# Patient Record
Sex: Female | Born: 1975 | Race: White | Hispanic: No | Marital: Married | Smoking: Former smoker
Health system: Southern US, Community
[De-identification: ages and names within clinical notes are randomized; demographics above are authoritative.]

## PROBLEM LIST (undated history)

## (undated) ENCOUNTER — Inpatient Hospital Stay (HOSPITAL_COMMUNITY): Payer: Self-pay

## (undated) DIAGNOSIS — Z9889 Other specified postprocedural states: Secondary | ICD-10-CM

## (undated) DIAGNOSIS — R112 Nausea with vomiting, unspecified: Secondary | ICD-10-CM

## (undated) DIAGNOSIS — Z87898 Personal history of other specified conditions: Secondary | ICD-10-CM

## (undated) DIAGNOSIS — T8859XA Other complications of anesthesia, initial encounter: Secondary | ICD-10-CM

## (undated) DIAGNOSIS — E119 Type 2 diabetes mellitus without complications: Secondary | ICD-10-CM

## (undated) DIAGNOSIS — IMO0001 Reserved for inherently not codable concepts without codable children: Secondary | ICD-10-CM

## (undated) DIAGNOSIS — T4145XA Adverse effect of unspecified anesthetic, initial encounter: Secondary | ICD-10-CM

## (undated) DIAGNOSIS — I10 Essential (primary) hypertension: Secondary | ICD-10-CM

## (undated) DIAGNOSIS — Z789 Other specified health status: Secondary | ICD-10-CM

## (undated) DIAGNOSIS — S60511A Abrasion of right hand, initial encounter: Secondary | ICD-10-CM

## (undated) DIAGNOSIS — F419 Anxiety disorder, unspecified: Secondary | ICD-10-CM

## (undated) DIAGNOSIS — J302 Other seasonal allergic rhinitis: Secondary | ICD-10-CM

## (undated) DIAGNOSIS — G478 Other sleep disorders: Secondary | ICD-10-CM

## (undated) DIAGNOSIS — Z794 Long term (current) use of insulin: Secondary | ICD-10-CM

## (undated) DIAGNOSIS — M65312 Trigger thumb, left thumb: Secondary | ICD-10-CM

## (undated) DIAGNOSIS — Z8489 Family history of other specified conditions: Secondary | ICD-10-CM

## (undated) HISTORY — PX: ANKLE SURGERY: SHX546

## (undated) HISTORY — PX: TONSILLECTOMY AND ADENOIDECTOMY: SHX28

## (undated) HISTORY — PX: NASAL SEPTUM SURGERY: SHX37

## (undated) HISTORY — PX: WISDOM TOOTH EXTRACTION: SHX21

---

## 1997-09-16 ENCOUNTER — Other Ambulatory Visit: Admission: RE | Admit: 1997-09-16 | Discharge: 1997-09-16 | Payer: Self-pay | Admitting: Obstetrics and Gynecology

## 1998-09-23 ENCOUNTER — Other Ambulatory Visit: Admission: RE | Admit: 1998-09-23 | Discharge: 1998-09-23 | Payer: Self-pay | Admitting: Obstetrics and Gynecology

## 2000-06-20 ENCOUNTER — Other Ambulatory Visit: Admission: RE | Admit: 2000-06-20 | Discharge: 2000-06-20 | Payer: Self-pay | Admitting: Obstetrics and Gynecology

## 2008-07-28 ENCOUNTER — Ambulatory Visit: Payer: Self-pay | Admitting: Family Medicine

## 2009-09-24 ENCOUNTER — Ambulatory Visit (HOSPITAL_BASED_OUTPATIENT_CLINIC_OR_DEPARTMENT_OTHER): Admission: RE | Admit: 2009-09-24 | Discharge: 2009-09-24 | Payer: Self-pay

## 2009-10-03 ENCOUNTER — Ambulatory Visit: Payer: Self-pay | Admitting: Internal Medicine

## 2010-01-28 ENCOUNTER — Encounter (HOSPITAL_BASED_OUTPATIENT_CLINIC_OR_DEPARTMENT_OTHER): Admission: RE | Admit: 2010-01-28 | Discharge: 2010-02-16 | Payer: Self-pay | Admitting: General Surgery

## 2010-06-24 ENCOUNTER — Ambulatory Visit (INDEPENDENT_AMBULATORY_CARE_PROVIDER_SITE_OTHER): Payer: BC Managed Care – PPO | Admitting: Family Medicine

## 2010-06-24 DIAGNOSIS — S139XXA Sprain of joints and ligaments of unspecified parts of neck, initial encounter: Secondary | ICD-10-CM

## 2010-06-28 ENCOUNTER — Ambulatory Visit (INDEPENDENT_AMBULATORY_CARE_PROVIDER_SITE_OTHER): Payer: BC Managed Care – PPO | Admitting: Family Medicine

## 2010-06-28 DIAGNOSIS — T7840XA Allergy, unspecified, initial encounter: Secondary | ICD-10-CM

## 2010-07-08 ENCOUNTER — Ambulatory Visit (INDEPENDENT_AMBULATORY_CARE_PROVIDER_SITE_OTHER): Payer: BC Managed Care – PPO | Admitting: Family Medicine

## 2010-07-08 DIAGNOSIS — S139XXA Sprain of joints and ligaments of unspecified parts of neck, initial encounter: Secondary | ICD-10-CM

## 2010-09-15 ENCOUNTER — Ambulatory Visit (INDEPENDENT_AMBULATORY_CARE_PROVIDER_SITE_OTHER): Payer: BC Managed Care – PPO | Admitting: Family Medicine

## 2010-09-15 ENCOUNTER — Encounter: Payer: Self-pay | Admitting: Family Medicine

## 2010-09-15 VITALS — BP 120/80 | HR 78 | Wt 165.0 lb

## 2010-09-15 DIAGNOSIS — M542 Cervicalgia: Secondary | ICD-10-CM
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
## 2010-09-15 NOTE — Patient Instructions (Signed)
I will refer you to physical therapy and if this does not work, I will refer to a chiropractor for their help.

## 2010-12-15 ENCOUNTER — Ambulatory Visit (INDEPENDENT_AMBULATORY_CARE_PROVIDER_SITE_OTHER): Payer: BC Managed Care – PPO | Admitting: Ophthalmology

## 2010-12-15 DIAGNOSIS — E11319 Type 2 diabetes mellitus with unspecified diabetic retinopathy without macular edema: Secondary | ICD-10-CM

## 2010-12-15 DIAGNOSIS — H43819 Vitreous degeneration, unspecified eye: Secondary | ICD-10-CM

## 2010-12-15 DIAGNOSIS — H2510 Age-related nuclear cataract, unspecified eye: Secondary | ICD-10-CM

## 2011-04-07 ENCOUNTER — Encounter: Payer: Self-pay | Admitting: Internal Medicine

## 2011-04-07 ENCOUNTER — Ambulatory Visit (INDEPENDENT_AMBULATORY_CARE_PROVIDER_SITE_OTHER): Payer: BC Managed Care – PPO | Admitting: Family Medicine

## 2011-04-07 DIAGNOSIS — E119 Type 2 diabetes mellitus without complications: Secondary | ICD-10-CM | POA: Insufficient documentation

## 2011-04-07 MED ORDER — HYDROCOD POLST-CHLORPHEN POLST 10-8 MG/5ML PO LQCR: 5.0000 mL | Freq: Two times a day (BID) | ORAL | Status: DC | PRN

## 2011-04-07 MED ORDER — AMOXICILLIN 875 MG PO TABS: 875.0000 mg | ORAL_TABLET | Freq: Two times a day (BID) | ORAL | Status: AC

## 2011-04-07 NOTE — Patient Instructions (Signed)
Take all the antibiotics and call me if not totally back to normal.

## 2011-04-07 NOTE — Progress Notes (Signed)
  Subjective:    Patient ID: Brandi Blevins, female    DOB: [birth year], 35 y.o.   MRN: [id number]  HPI 3 days ago she woke up with a sore throat, ear congestion. Next days the throat and congestion were worse with hoarse voice. Yesterday her cough became productive. She has also noted a worsening in her blood sugar readings. She states that she rarely gets a productive cough and a change in her blood sugars from URIs. Review of Systems     Objective:   Physical Exam alert and in no distress. Tympanic membranes and canals are normal. Throat is clear. Tonsils are normal. Neck is supple without adenopathy or thyromegaly. Cardiac exam shows a regular sinus rhythm without murmurs or gallops. Lungs are clear to auscultation.        Assessment & Plan:  Bronchitis. I will treat with amoxicillin. She is to call me at the end of the course of not entirely better

## 2011-04-21 ENCOUNTER — Telehealth: Payer: Self-pay | Admitting: Internal Medicine

## 2011-04-21 MED ORDER — AMOXICILLIN 875 MG PO TABS: 875.0000 mg | ORAL_TABLET | Freq: Two times a day (BID) | ORAL | Status: AC

## 2011-04-21 NOTE — Telephone Encounter (Signed)
Amoxil called in

## 2011-06-17 ENCOUNTER — Ambulatory Visit (INDEPENDENT_AMBULATORY_CARE_PROVIDER_SITE_OTHER): Payer: BC Managed Care – PPO | Admitting: Ophthalmology

## 2011-06-17 DIAGNOSIS — E11319 Type 2 diabetes mellitus with unspecified diabetic retinopathy without macular edema: Secondary | ICD-10-CM

## 2011-06-17 DIAGNOSIS — E1039 Type 1 diabetes mellitus with other diabetic ophthalmic complication: Secondary | ICD-10-CM

## 2011-06-17 DIAGNOSIS — H43819 Vitreous degeneration, unspecified eye: Secondary | ICD-10-CM

## 2011-08-01 ENCOUNTER — Ambulatory Visit (INDEPENDENT_AMBULATORY_CARE_PROVIDER_SITE_OTHER): Payer: BC Managed Care – PPO | Admitting: Family Medicine

## 2011-08-01 ENCOUNTER — Encounter: Payer: Self-pay | Admitting: Family Medicine

## 2011-08-01 VITALS — BP 120/80 | HR 68 | Temp 98.6°F | Resp 16 | Wt 172.0 lb

## 2011-08-01 DIAGNOSIS — H9209 Otalgia, unspecified ear: Secondary | ICD-10-CM

## 2011-08-01 DIAGNOSIS — J309 Allergic rhinitis, unspecified: Secondary | ICD-10-CM

## 2011-08-01 DIAGNOSIS — H9202 Otalgia, left ear: Secondary | ICD-10-CM

## 2011-08-01 DIAGNOSIS — H6980 Other specified disorders of Eustachian tube, unspecified ear: Secondary | ICD-10-CM

## 2011-08-01 DIAGNOSIS — H6120 Impacted cerumen, unspecified ear: Secondary | ICD-10-CM

## 2011-08-01 NOTE — Progress Notes (Signed)
Patient presents for evaluation of L ear pain.  She has allergies, but wants ear checked before going on plane tomorrow.  She admits to cleaning her ears with q-tips.  Yesterday when she cleaned her ears, it was painful.  Has pain with yawning, and with moving her external ear around (pulling on ear).  Some popping of ears.  Denies any ear drainage.  Ear feels stuffy, slight decrease in hearing.  Allergies have been pretty well controlled with her current regimen.  S/p sinus surgery, and since then her sinuses have been doing much better, only infrequent sinus infections.  Sugar was a little higher than normal since yesterday, 190 today.  Past Medical History  Diagnosis Date  . Diabetes mellitus     insulin dep  . Allergic rhinitis    Past Surgical History  Procedure Date  . Ankle surgery     x2  . Tonsillectomy   . Nasal sinus surgery    Current Outpatient Prescriptions on File Prior to Visit  Medication Sig Dispense Refill  . escitalopram (LEXAPRO) 10 MG tablet Take 10 mg by mouth daily.        . fluticasone (FLONASE) 50 MCG/ACT nasal spray Place 2 sprays into the nose daily.        . insulin aspart (NOVOLOG) 100 UNIT/ML injection Inject into the skin 3 (three) times daily before meals.       Marland Kitchen levocetirizine (XYZAL) 5 MG tablet Take 5 mg by mouth every evening.        . Multiple Vitamins-Minerals (MULTIVITAMIN WITH MINERALS) tablet Take 1 tablet by mouth daily.         MEDS:  Also reports taking a med for blood pressure, which she believes to be a diuretic--thinks it is HCTZ.  Allergies  Allergen Reactions  . Advil (Ibuprofen)    ROS: Denies headaches, fevers, nausea, vomiting, diarrhea, skin rash, chest pain, shortness of breath or cough.  +sneezing.  PHYSICAL EXAM: BP 120/80  Pulse 68  Temp(Src) 98.6 F (37 C) (Oral)  Resp 16  Wt 172 lb (78.019 kg)  LMP 07/21/2011 Well developed, pleasant female in no distress HEENT:  PERRL, EOMI, conjunctiva clear. Nasal mucosa  moderately edematous, L>R with clear-white mucus TM and EAC normal on right.  On Left, slight redness of EAC mid canal, no exudate.  TM obscured by cerumen  After lavage and use of curette, TM was visualized and normal.  OP clear Neck: no lymphadenopathy, thyromegaly or mass Heart: regular rate and rhythm Lungs: clear  ASSESSMENT/PLAN: 1. Otalgia of left ear    2. Cerumen impaction  Ear cerumen removal  3. Allergic rhinitis, cause unspecified    4. Eustachian tube dysfunction     Advised NOT to use Q-tips. Use Sudafed prior to flying (since BP is well controlled)--vs using Afrin Continue flonase and xyzal If persistent/worsening L ear pain, can call for ABX drops to treat possible OE

## 2011-08-01 NOTE — Patient Instructions (Signed)
Use sudafed before flying. Don't use q-tips Call later in week if worsening ear pain for ear drops--I don't think it is necessary now, only if pain worsens

## 2011-11-18 ENCOUNTER — Other Ambulatory Visit: Payer: Self-pay

## 2011-11-18 LAB — OB RESULTS CONSOLE HEPATITIS B SURFACE ANTIGEN: Hepatitis B Surface Ag: NEGATIVE

## 2011-11-18 LAB — OB RESULTS CONSOLE RPR: RPR: NONREACTIVE

## 2011-11-18 LAB — OB RESULTS CONSOLE RUBELLA ANTIBODY, IGM: Rubella: IMMUNE

## 2011-12-14 ENCOUNTER — Other Ambulatory Visit (HOSPITAL_COMMUNITY): Payer: Self-pay | Admitting: Obstetrics and Gynecology

## 2011-12-14 DIAGNOSIS — O24919 Unspecified diabetes mellitus in pregnancy, unspecified trimester: Secondary | ICD-10-CM

## 2011-12-14 DIAGNOSIS — Z3689 Encounter for other specified antenatal screening: Secondary | ICD-10-CM

## 2011-12-23 ENCOUNTER — Ambulatory Visit (INDEPENDENT_AMBULATORY_CARE_PROVIDER_SITE_OTHER): Payer: BC Managed Care – PPO | Admitting: Ophthalmology

## 2011-12-30 ENCOUNTER — Ambulatory Visit (HOSPITAL_COMMUNITY)
Admission: RE | Admit: 2011-12-30 | Discharge: 2011-12-30 | Disposition: A | Discharge status: Home / Self Care | Payer: BC Managed Care – PPO | Source: Ambulatory Visit | Attending: Obstetrics and Gynecology | Admitting: Obstetrics and Gynecology

## 2011-12-30 ENCOUNTER — Encounter (HOSPITAL_COMMUNITY): Payer: Self-pay

## 2011-12-30 VITALS — BP 124/72 | HR 70 | Wt 171.5 lb

## 2011-12-30 DIAGNOSIS — O10019 Pre-existing essential hypertension complicating pregnancy, unspecified trimester: Secondary | ICD-10-CM | POA: Insufficient documentation

## 2011-12-30 DIAGNOSIS — O24919 Unspecified diabetes mellitus in pregnancy, unspecified trimester: Secondary | ICD-10-CM

## 2011-12-30 DIAGNOSIS — Z363 Encounter for antenatal screening for malformations: Secondary | ICD-10-CM | POA: Insufficient documentation

## 2011-12-30 DIAGNOSIS — O358XX0 Maternal care for other (suspected) fetal abnormality and damage, not applicable or unspecified: Secondary | ICD-10-CM | POA: Insufficient documentation

## 2011-12-30 DIAGNOSIS — O09519 Supervision of elderly primigravida, unspecified trimester: Secondary | ICD-10-CM | POA: Insufficient documentation

## 2011-12-30 DIAGNOSIS — Z1389 Encounter for screening for other disorder: Secondary | ICD-10-CM | POA: Insufficient documentation

## 2011-12-30 DIAGNOSIS — Z3689 Encounter for other specified antenatal screening: Secondary | ICD-10-CM

## 2011-12-30 HISTORY — DX: Essential (primary) hypertension: I10

## 2011-12-30 NOTE — Progress Notes (Signed)
Ms. Comrie had an ultrasound appointment today.  Please see [facility] report for details.  Comments An active singleton fetus is observed.  Biometry is appropriate for gestational age.  Amniotic fluid volume is normal.  Screening survey of the fetal anatomy was performed and no dysmorphic features are detected on attempted comprehensive anatomic survey limited by early gestational age. Images of the fetal heart (4 Chamber, LVOT, RVOT, aortic arch, and ductal arch) and palate are suboptimal and warrant follow up evaluation.  Impression Active singleton fetus Normal anatomic survey (limited by gestational age) Suboptimal imaging of the fetal heart (4 Chamber, LVOT, RVOT, aortic arch, and ductal arch) and palate  Maternal Diabetes Mellitus Chronic Hypertension  Recommendations 1. As a courtesy, I scheduled your patient a follow up ultrasound to complete our evaluation of the fetal anatomy (heart and palate) and assess interval growth in 4 weeks.   2. Due to maternal history of DM, I ordered a referral to pediatric cardiology for fetal echocardiogram at around [redacted] weeks gestational age.  Rogelia Boga, MD, MS, FACOG Assistant Professor Section of Maternal-Fetal Medicine [facility]

## 2012-01-27 ENCOUNTER — Ambulatory Visit (HOSPITAL_COMMUNITY)
Admission: RE | Admit: 2012-01-27 | Discharge: 2012-01-27 | Disposition: A | Discharge status: Home / Self Care | Payer: BC Managed Care – PPO | Source: Ambulatory Visit | Attending: Obstetrics and Gynecology | Admitting: Obstetrics and Gynecology

## 2012-01-27 DIAGNOSIS — O09519 Supervision of elderly primigravida, unspecified trimester: Secondary | ICD-10-CM | POA: Insufficient documentation

## 2012-01-27 DIAGNOSIS — O24919 Unspecified diabetes mellitus in pregnancy, unspecified trimester: Secondary | ICD-10-CM

## 2012-01-27 DIAGNOSIS — O10019 Pre-existing essential hypertension complicating pregnancy, unspecified trimester: Secondary | ICD-10-CM | POA: Insufficient documentation

## 2012-01-27 DIAGNOSIS — Z3689 Encounter for other specified antenatal screening: Secondary | ICD-10-CM | POA: Insufficient documentation

## 2012-01-27 NOTE — Progress Notes (Signed)
[facility]  Indication: 36 yr old G1P0 at [redacted]w[redacted]d with type I diabetes and likely chronic hypertension for follow up ultrasound to complete anatomy.  Findings: 1. Single intrauterine pregnancy. 2. Estimated fetal weight is in the 40th%. 3. Posterior placenta without evidence of previa. 4. Normal amniotic fluid volume. 5. The limited anatomy survey is normal. Any anatomy not seen on today's exam was evaluated on the previous exam. 6. Normal transabdominal cervical length.  Recommendations: 1. Appropriate fetal growth. 2. Fetal anatomic survey is complete. 3. Type I Diabetes: - recommend fetal echocardiogram- scheduled for 02/06/12 - recommend fetal growth ultrasounds every 4 weeks; can be done in our office if desired - recommend antenatal testing starting at [redacted] weeks gestation 4. Likely chronic hypertension: - well controlled on nifedipine - recommend serial fetal growth and antenatal testing as above  Eulis Foster, MD

## 2012-01-31 ENCOUNTER — Encounter (HOSPITAL_COMMUNITY): Payer: Self-pay | Admitting: Obstetrics and Gynecology

## 2012-02-02 ENCOUNTER — Other Ambulatory Visit (HOSPITAL_COMMUNITY): Payer: Self-pay | Admitting: Obstetrics and Gynecology

## 2012-02-02 DIAGNOSIS — O24919 Unspecified diabetes mellitus in pregnancy, unspecified trimester: Secondary | ICD-10-CM

## 2012-02-06 ENCOUNTER — Ambulatory Visit (HOSPITAL_COMMUNITY)
Admission: RE | Admit: 2012-02-06 | Discharge: 2012-02-06 | Disposition: A | Discharge status: Home / Self Care | Payer: BC Managed Care – PPO | Source: Ambulatory Visit | Attending: Obstetrics and Gynecology | Admitting: Obstetrics and Gynecology

## 2012-02-06 ENCOUNTER — Other Ambulatory Visit (HOSPITAL_COMMUNITY): Payer: Self-pay | Admitting: Obstetrics and Gynecology

## 2012-02-06 DIAGNOSIS — O24919 Unspecified diabetes mellitus in pregnancy, unspecified trimester: Secondary | ICD-10-CM

## 2012-02-15 NOTE — Consult Note (Signed)
Dear  Colleagues,  I had the pleasure of seeing Brandi Blevins on 28 Oct 13 for consultation in the Fetal Heart Program at [facility]. As you know, she is a 36 y.o. G1P whose pregnancy was at 23 weeks 3 days gestation at the time of our visit.  She was referred for fetal cardiovascular evaluation secondary to maternal diabetes (juvenile onset). This pregnancy was conceived naturally. Amniocentesis has not been required. She has not experienced any significant intercurrent illnesses or hospitalizations. She is on prenatal vitamins and insulin and she also requires Nifedipine for control of hypertension and Rhinocort for seasonal allergies.  Her sugar control has been fairly well controlled by Betsey's report.  A recent HgA1C of 6.6% is noted.  Zaara thinks it was probably in same range or better during first trimester.  She denies any exposure to tobacco, alcohol or recreational drugs. She denies any complaints today.  All systems reviewed and negative other than listed in HPI.  There is no family history of congenital heart disease, birth defects, genetic abnormalities, arrhythmia, pacemakers or sudden cardiac death.    We performed a complete Fetal echocardiogram with Doppler assessment which I personally reviewed and interpreted. Please refer to a copy of the final report in [facility] for further details.  Acoustic windows were actually quite good.  We did not identify any significant intracardiac abnormalities - no structural defects.  There was normal Doppler patterns in the umbilical vessels, ductus venosus and middle cerebral artery. There was a normal fetal heart rate and rhythm.  In addition, there was no ventricular hypertrophy.  This information was discussed with Raelan and her husband. We also discussed that fetal echocardiography may not be able to detect minor abnormalities such as small ventricular septal defects, subtle valve abnormalities or mild coarctation of  the aorta. Nor can it predict postnatal persistence of normal fetal structures such as a patent foramen ovale or patent ductus arteriosus.  I do not feel that Rahel requires a prenatal follow-up appointment in our [facility] Fetal [facility] here at [facility].  But I am glad to see her back if you have any persistent concerns or if new clinical problems arise. I also do not feel that a post-natal echocardiogram is necessary after delivery unless indicated by clinical conditions. Please do not hesitate to contact me with any questions or concerns.  Thank you for involving me in the care of your patient today.   Sincerely,  Massie Bougie, MD Pediatric Cardiology [facility]  Total Time: 30 minues. Time of Counseling/Coordinating care: 20 minutes.

## 2012-03-02 ENCOUNTER — Telehealth: Payer: Self-pay | Admitting: *Deleted

## 2012-03-02 ENCOUNTER — Encounter: Payer: Self-pay | Admitting: *Deleted

## 2012-03-02 ENCOUNTER — Ambulatory Visit (INDEPENDENT_AMBULATORY_CARE_PROVIDER_SITE_OTHER): Payer: BC Managed Care – PPO | Admitting: *Deleted

## 2012-03-02 VITALS — Ht 63.0 in

## 2012-03-02 DIAGNOSIS — IMO0002 Reserved for concepts with insufficient information to code with codable children: Secondary | ICD-10-CM

## 2012-03-02 DIAGNOSIS — O24919 Unspecified diabetes mellitus in pregnancy, unspecified trimester: Secondary | ICD-10-CM

## 2012-03-02 NOTE — Telephone Encounter (Signed)
I spoke with [facility] for diagnosis and if they needed original EKG sent to them. I have faxed a copy of the ekg to them as well as giving a copy of EKG to the pt. Mylo Red RN

## 2012-03-03 ENCOUNTER — Encounter (HOSPITAL_COMMUNITY): Payer: Self-pay

## 2012-03-03 ENCOUNTER — Inpatient Hospital Stay (HOSPITAL_COMMUNITY)
Admission: AD | Admit: 2012-03-03 | Discharge: 2012-03-03 | Disposition: A | Discharge status: Home / Self Care | Payer: BC Managed Care – PPO | Source: Ambulatory Visit | Attending: Obstetrics and Gynecology | Admitting: Obstetrics and Gynecology

## 2012-03-03 DIAGNOSIS — O4700 False labor before 37 completed weeks of gestation, unspecified trimester: Secondary | ICD-10-CM | POA: Insufficient documentation

## 2012-03-03 DIAGNOSIS — E109 Type 1 diabetes mellitus without complications: Secondary | ICD-10-CM | POA: Insufficient documentation

## 2012-03-03 DIAGNOSIS — O24919 Unspecified diabetes mellitus in pregnancy, unspecified trimester: Secondary | ICD-10-CM | POA: Insufficient documentation

## 2012-03-03 DIAGNOSIS — K529 Noninfective gastroenteritis and colitis, unspecified: Secondary | ICD-10-CM

## 2012-03-03 DIAGNOSIS — O99891 Other specified diseases and conditions complicating pregnancy: Secondary | ICD-10-CM | POA: Insufficient documentation

## 2012-03-03 DIAGNOSIS — A059 Bacterial foodborne intoxication, unspecified: Secondary | ICD-10-CM | POA: Insufficient documentation

## 2012-03-03 DIAGNOSIS — K5289 Other specified noninfective gastroenteritis and colitis: Secondary | ICD-10-CM

## 2012-03-03 HISTORY — DX: Other complications of anesthesia, initial encounter: T88.59XA

## 2012-03-03 HISTORY — DX: Anxiety disorder, unspecified: F41.9

## 2012-03-03 HISTORY — DX: Nausea with vomiting, unspecified: R11.2

## 2012-03-03 HISTORY — DX: Other specified postprocedural states: Z98.890

## 2012-03-03 HISTORY — DX: Adverse effect of unspecified anesthetic, initial encounter: T41.45XA

## 2012-03-03 LAB — URINALYSIS, ROUTINE W REFLEX MICROSCOPIC
Nitrite: NEGATIVE
Specific Gravity, Urine: 1.025 (ref 1.005–1.030)
Urobilinogen, UA: 0.2 mg/dL (ref 0.0–1.0)
pH: 6 (ref 5.0–8.0)

## 2012-03-03 LAB — URINE MICROSCOPIC-ADD ON

## 2012-03-03 LAB — FETAL FIBRONECTIN: Fetal Fibronectin: NEGATIVE

## 2012-03-03 MED ORDER — ONDANSETRON HCL 4 MG PO TABS
8.0000 mg | ORAL_TABLET | Freq: Once | ORAL | Status: AC
  Administered 2012-03-03: 8 mg via ORAL
  Filled 2012-03-03: qty 2

## 2012-03-03 MED ORDER — ONDANSETRON HCL 8 MG PO TABS: 8.0000 mg | ORAL_TABLET | Freq: Three times a day (TID) | ORAL | Status: DC | PRN

## 2012-03-03 NOTE — MAU Note (Signed)
N/v/d x2 episodes this morning and contractions since she woke up 1.5 hr ago. Doesn't know how frequent. Denies vaginal bleeding/leaking of fluid.

## 2012-03-03 NOTE — MAU Provider Note (Signed)
History     CSN: [id number]  Arrival date and time: 03/03/12 4098   First Provider Initiated Contact with Patient 03/03/12 [phone]      Chief Complaint  Patient presents with  . Contractions  . Emesis During Pregnancy   HPI This is a 36 y.o. female at [redacted]w[redacted]d who presents with c/o episode at home with contractions described as painful, sharp and dull, lower abdomen with tightening. This was followed by emesis x 2 and then diarrhea.  Husband states he also feels nauseated. She denies fever or abdominal pain. No history of PTL.  No bleeding. Has not had any contractions since arrival to hospital.  History is notable for Type I Diabetes. Blood sugar this morning was 195 which was unusual for her. She gave herself insulin for this. She is on an insulin pump.  RN Note: N/v/d x2 episodes this morning and contractions since she woke up 1.5 hr ago. Doesn't know how frequent. Denies vaginal bleeding/leaking of fluid.       OB History    Grav Para Term Preterm Abortions TAB SAB Ect Mult Living   1 0 0 0 0 0 0 0 0 0       Past Medical History  Diagnosis Date  . Allergic rhinitis   . Hypertension     chronic HTN x2 yrs  . Diabetes mellitus     insulin dep  . Anxiety   . Complication of anesthesia   . PONV (postoperative nausea and vomiting)     Past Surgical History  Procedure Date  . Ankle surgery     x2  . Tonsillectomy   . Nasal sinus surgery   . Wisdom tooth extraction     History reviewed. No pertinent family history.  History  Substance Use Topics  . Smoking status: Former Smoker    Quit date: 04/11/2005  . Smokeless tobacco: Never Used  . Alcohol Use: Yes     Comment: 1-2 glasses of wine 3-4 days/week    Allergies:  Allergies  Allergen Reactions  . Advil (Ibuprofen) Hives    Prescriptions prior to admission  Medication Sig Dispense Refill  . Budesonide (RHINOCORT AQUA NA) Place 1 spray into the nose.      . insulin aspart (NOVOLOG) 100 UNIT/ML injection  Inject into the skin 3 (three) times daily before meals.       Marland Kitchen levocetirizine (XYZAL) 5 MG tablet Take 5 mg by mouth every evening.        . Multiple Vitamins-Minerals (MULTIVITAMIN WITH MINERALS) tablet Take 1 tablet by mouth daily.        Marland Kitchen NIFEdipine (PROCARDIA-XL/ADALAT-CC/NIFEDICAL-XL) 30 MG 24 hr tablet Take 30 mg by mouth daily.      Marland Kitchen escitalopram (LEXAPRO) 10 MG tablet Take 10 mg by mouth daily.        . fluticasone (FLONASE) 50 MCG/ACT nasal spray Place 2 sprays into the nose daily.          ROS See HPI  Physical Exam   Blood pressure 131/87, pulse 93, temperature 97.1 F (36.2 C), temperature source Oral, resp. rate 18, last menstrual period 08/22/2011.  Physical Exam  Constitutional: She is oriented to person, place, and time. She appears well-developed and well-nourished. No distress.  Cardiovascular: Normal rate.   Respiratory: Effort normal.  GI: Soft. She exhibits no distension and no mass. There is no tenderness. There is no rebound and no guarding.  Genitourinary: Vagina normal and uterus normal. No vaginal discharge found.  Dilation: Closed Effacement (%): Thick Exam by:: Wynelle Bourgeois CNM   Musculoskeletal: Normal range of motion.  Neurological: She is alert and oriented to person, place, and time.  Skin: Skin is warm and dry.  Psychiatric: She has a normal mood and affect.  FHR reassuring with small accels No contractions.  MAU Course  Procedures  Discussed with DR Henderson Cloud. Will send FFn and give dose of Zofran. Then try sips of PO fluids. >>> FFn is Negative. No contractions while here. Feels better, Tolerating PO fluids.  Assessment and Plan  A:  SIUP at [redacted]w[redacted]d       Gastroenteritis vs food poisoning      Preterm contractions with no cervical change, possibly due to #2  P:  D/C home      Rx Zofran PRN      Supportive care  [facility] 03/03/2012, 7:13 AM

## 2012-03-06 LAB — URINE CULTURE

## 2012-04-20 ENCOUNTER — Encounter (HOSPITAL_COMMUNITY): Payer: Self-pay | Admitting: Anesthesiology

## 2012-04-20 ENCOUNTER — Encounter (HOSPITAL_COMMUNITY)
Admission: AD | Disposition: A | Discharge status: Home / Self Care | Payer: Self-pay | Source: Ambulatory Visit | Attending: Obstetrics and Gynecology

## 2012-04-20 ENCOUNTER — Inpatient Hospital Stay (HOSPITAL_COMMUNITY): Payer: Federal, State, Local not specified - PPO | Admitting: Anesthesiology

## 2012-04-20 ENCOUNTER — Encounter (HOSPITAL_COMMUNITY): Payer: Self-pay | Admitting: *Deleted

## 2012-04-20 ENCOUNTER — Inpatient Hospital Stay (HOSPITAL_COMMUNITY)
Admission: AD | Admit: 2012-04-20 | Discharge: 2012-04-24 | DRG: 650 | Disposition: A | Discharge status: Home / Self Care | Payer: Federal, State, Local not specified - PPO | Source: Ambulatory Visit | Attending: Obstetrics and Gynecology | Admitting: Obstetrics and Gynecology

## 2012-04-20 DIAGNOSIS — O2432 Unspecified pre-existing diabetes mellitus in childbirth: Secondary | ICD-10-CM | POA: Diagnosis present

## 2012-04-20 DIAGNOSIS — E119 Type 2 diabetes mellitus without complications: Secondary | ICD-10-CM | POA: Diagnosis present

## 2012-04-20 DIAGNOSIS — O321XX0 Maternal care for breech presentation, not applicable or unspecified: Secondary | ICD-10-CM | POA: Diagnosis present

## 2012-04-20 DIAGNOSIS — O09529 Supervision of elderly multigravida, unspecified trimester: Secondary | ICD-10-CM | POA: Diagnosis present

## 2012-04-20 DIAGNOSIS — O1414 Severe pre-eclampsia complicating childbirth: Principal | ICD-10-CM | POA: Diagnosis present

## 2012-04-20 LAB — CBC WITH DIFFERENTIAL/PLATELET
Basophils Absolute: 0.1 10*3/uL (ref 0.0–0.1)
Basophils Relative: 0 % (ref 0–1)
Eosinophils Absolute: 0.1 10*3/uL (ref 0.0–0.7)
Eosinophils Relative: 0 % (ref 0–5)
HCT: 34.9 % — ABNORMAL LOW (ref 36.0–46.0)
Hemoglobin: 12.1 g/dL (ref 12.0–15.0)
Lymphocytes Relative: 16 % (ref 12–46)
Lymphs Abs: 2 10*3/uL (ref 0.7–4.0)
MCH: 30 pg (ref 26.0–34.0)
MCHC: 34.7 g/dL (ref 30.0–36.0)
MCV: 86.6 fL (ref 78.0–100.0)
Monocytes Absolute: 0.7 10*3/uL (ref 0.1–1.0)
Monocytes Relative: 6 % (ref 3–12)
Neutro Abs: 9.4 10*3/uL — ABNORMAL HIGH (ref 1.7–7.7)
Neutrophils Relative %: 77 % (ref 43–77)
Platelets: 159 10*3/uL (ref 150–400)
RBC: 4.03 MIL/uL (ref 3.87–5.11)
RDW: 12.5 % (ref 11.5–15.5)
WBC: 12.2 10*3/uL — ABNORMAL HIGH (ref 4.0–10.5)

## 2012-04-20 LAB — TYPE AND SCREEN: Antibody Screen: NEGATIVE

## 2012-04-20 LAB — COMPREHENSIVE METABOLIC PANEL
ALT: 69 U/L — ABNORMAL HIGH (ref 0–35)
AST: 73 U/L — ABNORMAL HIGH (ref 0–37)
Calcium: 9.1 mg/dL (ref 8.4–10.5)
GFR calc Af Amer: >90 mL/min (ref >90)
Sodium: 131 mEq/L — ABNORMAL LOW (ref 135–145)
Total Protein: 6.1 g/dL (ref 6.0–8.3)

## 2012-04-20 LAB — LACTATE DEHYDROGENASE: LDH: 362 U/L — ABNORMAL HIGH (ref 94–250)

## 2012-04-20 LAB — GLUCOSE, CAPILLARY
Glucose-Capillary: 157 mg/dL — ABNORMAL HIGH (ref 70–99)
Glucose-Capillary: 159 mg/dL — ABNORMAL HIGH (ref 70–99)
Glucose-Capillary: 180 mg/dL — ABNORMAL HIGH (ref 70–99)

## 2012-04-20 LAB — ABO/RH: ABO/RH(D): O POS

## 2012-04-20 LAB — URIC ACID: Uric Acid, Serum: 5.3 mg/dL (ref 2.4–7.0)

## 2012-04-20 SURGERY — Surgical Case
Anesthesia: Spinal | Site: Abdomen | Wound class: Clean Contaminated

## 2012-04-20 MED ORDER — MEPERIDINE HCL 25 MG/ML IJ SOLN: 6.2500 mg | INTRAMUSCULAR | Status: DC | PRN

## 2012-04-20 MED ORDER — FENTANYL CITRATE 0.05 MG/ML IJ SOLN
INTRAMUSCULAR | Status: AC
  Filled 2012-04-20: qty 2

## 2012-04-20 MED ORDER — PRENATAL MULTIVITAMIN CH
1.0000 | ORAL_TABLET | Freq: Every day | ORAL | Status: DC
  Filled 2012-04-20: qty 1

## 2012-04-20 MED ORDER — SIMETHICONE 80 MG PO CHEW
80.0000 mg | CHEWABLE_TABLET | Freq: Three times a day (TID) | ORAL | Status: DC
  Administered 2012-04-20 – 2012-04-24 (×12): 80 mg via ORAL

## 2012-04-20 MED ORDER — NALOXONE HCL 0.4 MG/ML IJ SOLN: 0.4000 mg | INTRAMUSCULAR | Status: DC | PRN

## 2012-04-20 MED ORDER — DIBUCAINE 1 % RE OINT: 1.0000 "application " | TOPICAL_OINTMENT | RECTAL | Status: DC | PRN

## 2012-04-20 MED ORDER — SCOPOLAMINE 1 MG/3DAYS TD PT72
1.0000 | MEDICATED_PATCH | Freq: Once | TRANSDERMAL | Status: DC
  Filled 2012-04-20 (×2): qty 1

## 2012-04-20 MED ORDER — ZOLPIDEM TARTRATE 5 MG PO TABS: 5.0000 mg | ORAL_TABLET | Freq: Every evening | ORAL | Status: DC | PRN

## 2012-04-20 MED ORDER — LEVOCETIRIZINE DIHYDROCHLORIDE 5 MG PO TABS: 5.0000 mg | ORAL_TABLET | Freq: Every evening | ORAL | Status: DC

## 2012-04-20 MED ORDER — MAGNESIUM SULFATE BOLUS VIA INFUSION
6.0000 g | Freq: Once | INTRAVENOUS | Status: AC
  Administered 2012-04-20: 6 g via INTRAVENOUS
  Filled 2012-04-20: qty 500

## 2012-04-20 MED ORDER — PHENYLEPHRINE 40 MCG/ML (10ML) SYRINGE FOR IV PUSH (FOR BLOOD PRESSURE SUPPORT)
PREFILLED_SYRINGE | INTRAVENOUS | Status: AC
  Filled 2012-04-20: qty 20

## 2012-04-20 MED ORDER — MAGNESIUM SULFATE 40 G IN LACTATED RINGERS - SIMPLE
2.0000 g/h | INTRAVENOUS | Status: DC
  Administered 2012-04-20: 25 mL/h via INTRAVENOUS
  Filled 2012-04-20: qty 500

## 2012-04-20 MED ORDER — OXYTOCIN 40 UNITS IN LACTATED RINGERS INFUSION - SIMPLE MED: 62.5000 mL/h | INTRAVENOUS | Status: AC

## 2012-04-20 MED ORDER — OXYCODONE-ACETAMINOPHEN 5-325 MG PO TABS
1.0000 | ORAL_TABLET | ORAL | Status: DC | PRN
  Administered 2012-04-21 (×2): 1 via ORAL
  Administered 2012-04-21 – 2012-04-22 (×5): 2 via ORAL
  Filled 2012-04-20: qty 1
  Filled 2012-04-20 (×2): qty 2
  Filled 2012-04-20: qty 1
  Filled 2012-04-20 (×2): qty 2
  Filled 2012-04-20: qty 1

## 2012-04-20 MED ORDER — ONDANSETRON HCL 4 MG PO TABS
4.0000 mg | ORAL_TABLET | ORAL | Status: DC | PRN
  Administered 2012-04-24: 4 mg via ORAL
  Filled 2012-04-20: qty 1

## 2012-04-20 MED ORDER — DIPHENHYDRAMINE HCL 50 MG/ML IJ SOLN
12.5000 mg | INTRAMUSCULAR | Status: DC | PRN
  Administered 2012-04-20: 12.5 mg via INTRAVENOUS
  Filled 2012-04-20: qty 1

## 2012-04-20 MED ORDER — NALBUPHINE HCL 10 MG/ML IJ SOLN: 5.0000 mg | INTRAMUSCULAR | Status: DC | PRN

## 2012-04-20 MED ORDER — MAGNESIUM SULFATE 40 G IN LACTATED RINGERS - SIMPLE
2.0000 g/h | INTRAVENOUS | Status: AC
  Administered 2012-04-21: 2 g/h via INTRAVENOUS
  Filled 2012-04-20 (×2): qty 500

## 2012-04-20 MED ORDER — FENTANYL CITRATE 0.05 MG/ML IJ SOLN
INTRAMUSCULAR | Status: DC | PRN
  Administered 2012-04-20: 25 ug via INTRATHECAL

## 2012-04-20 MED ORDER — LACTATED RINGERS IV SOLN
INTRAVENOUS | Status: AC
  Administered 2012-04-21: 13:00:00 via INTRAVENOUS
  Administered 2012-04-21: 100 mL/h via INTRAVENOUS

## 2012-04-20 MED ORDER — CEFAZOLIN SODIUM-DEXTROSE 2-3 GM-% IV SOLR
2.0000 g | INTRAVENOUS | Status: AC
  Administered 2012-04-20: 2 g via INTRAVENOUS
  Filled 2012-04-20: qty 50

## 2012-04-20 MED ORDER — LACTATED RINGERS IV SOLN
INTRAVENOUS | Status: DC
  Administered 2012-04-20: 10:00:00 via INTRAVENOUS

## 2012-04-20 MED ORDER — MORPHINE SULFATE 0.5 MG/ML IJ SOLN
INTRAMUSCULAR | Status: AC
  Filled 2012-04-20: qty 10

## 2012-04-20 MED ORDER — BUPIVACAINE IN DEXTROSE 0.75-8.25 % IT SOLN
INTRATHECAL | Status: DC | PRN
  Administered 2012-04-20: 1.5 mL via INTRATHECAL

## 2012-04-20 MED ORDER — WITCH HAZEL-GLYCERIN EX PADS: 1.0000 "application " | MEDICATED_PAD | CUTANEOUS | Status: DC | PRN

## 2012-04-20 MED ORDER — DIPHENHYDRAMINE HCL 50 MG/ML IJ SOLN: 25.0000 mg | INTRAMUSCULAR | Status: DC | PRN

## 2012-04-20 MED ORDER — LANOLIN HYDROUS EX OINT: 1.0000 "application " | TOPICAL_OINTMENT | CUTANEOUS | Status: DC | PRN

## 2012-04-20 MED ORDER — MIDAZOLAM HCL 2 MG/2ML IJ SOLN: 0.5000 mg | Freq: Once | INTRAMUSCULAR | Status: DC | PRN

## 2012-04-20 MED ORDER — ONDANSETRON HCL 4 MG/2ML IJ SOLN: 4.0000 mg | Freq: Three times a day (TID) | INTRAMUSCULAR | Status: DC | PRN

## 2012-04-20 MED ORDER — INSULIN PUMP: 1.0000 | SUBCUTANEOUS | Status: DC | PRN

## 2012-04-20 MED ORDER — ACETAMINOPHEN 325 MG PO TABS
650.0000 mg | ORAL_TABLET | ORAL | Status: DC | PRN
  Administered 2012-04-20 – 2012-04-23 (×4): 650 mg via ORAL
  Filled 2012-04-20 (×4): qty 2

## 2012-04-20 MED ORDER — SENNOSIDES-DOCUSATE SODIUM 8.6-50 MG PO TABS
2.0000 | ORAL_TABLET | Freq: Every day | ORAL | Status: DC
  Administered 2012-04-20 – 2012-04-23 (×4): 2 via ORAL

## 2012-04-20 MED ORDER — ACETAMINOPHEN 10 MG/ML IV SOLN: 1000.0000 mg | Freq: Four times a day (QID) | INTRAVENOUS | Status: AC | PRN

## 2012-04-20 MED ORDER — CITRIC ACID-SODIUM CITRATE 334-500 MG/5ML PO SOLN
30.0000 mL | Freq: Once | ORAL | Status: AC
  Administered 2012-04-20: 30 mL via ORAL
  Filled 2012-04-20: qty 15

## 2012-04-20 MED ORDER — SODIUM CHLORIDE 0.9 % IJ SOLN: 3.0000 mL | INTRAMUSCULAR | Status: DC | PRN

## 2012-04-20 MED ORDER — ONDANSETRON HCL 4 MG/2ML IJ SOLN
INTRAMUSCULAR | Status: DC | PRN
  Administered 2012-04-20: 4 mg via INTRAVENOUS

## 2012-04-20 MED ORDER — OXYTOCIN 10 UNIT/ML IJ SOLN
40.0000 [IU] | INTRAVENOUS | Status: DC | PRN
  Administered 2012-04-20: 40 [IU] via INTRAVENOUS

## 2012-04-20 MED ORDER — SIMETHICONE 80 MG PO CHEW: 80.0000 mg | CHEWABLE_TABLET | ORAL | Status: DC | PRN

## 2012-04-20 MED ORDER — PROMETHAZINE HCL 25 MG/ML IJ SOLN: 6.2500 mg | INTRAMUSCULAR | Status: DC | PRN

## 2012-04-20 MED ORDER — INSULIN PUMP
1.0000 | SUBCUTANEOUS | Status: DC
  Administered 2012-04-20 – 2012-04-21 (×8): 1 via SUBCUTANEOUS
  Administered 2012-04-22: 1.8 via SUBCUTANEOUS
  Administered 2012-04-22: 1.6 via SUBCUTANEOUS
  Administered 2012-04-22: 1.5 via SUBCUTANEOUS
  Administered 2012-04-23 – 2012-04-24 (×5): 1 via SUBCUTANEOUS

## 2012-04-20 MED ORDER — METOCLOPRAMIDE HCL 5 MG/ML IJ SOLN: 10.0000 mg | Freq: Three times a day (TID) | INTRAMUSCULAR | Status: DC | PRN

## 2012-04-20 MED ORDER — TETANUS-DIPHTH-ACELL PERTUSSIS 5-2.5-18.5 LF-MCG/0.5 IM SUSP
0.5000 mL | Freq: Once | INTRAMUSCULAR | Status: DC
  Filled 2012-04-20: qty 0.5

## 2012-04-20 MED ORDER — MENTHOL 3 MG MT LOZG: 1.0000 | LOZENGE | OROMUCOSAL | Status: DC | PRN

## 2012-04-20 MED ORDER — FAMOTIDINE IN NACL 20-0.9 MG/50ML-% IV SOLN
20.0000 mg | Freq: Once | INTRAVENOUS | Status: DC
  Filled 2012-04-20: qty 50

## 2012-04-20 MED ORDER — LORATADINE 10 MG PO TABS
10.0000 mg | ORAL_TABLET | Freq: Every day | ORAL | Status: DC
  Administered 2012-04-20 – 2012-04-23 (×4): 10 mg via ORAL
  Filled 2012-04-20 (×5): qty 1

## 2012-04-20 MED ORDER — ATROPINE SULFATE 0.4 MG/ML IJ SOLN
INTRAMUSCULAR | Status: AC
  Filled 2012-04-20: qty 1

## 2012-04-20 MED ORDER — NALOXONE HCL 1 MG/ML IJ SOLN: 1.0000 ug/kg/h | INTRAMUSCULAR | Status: DC | PRN

## 2012-04-20 MED ORDER — FENTANYL CITRATE 0.05 MG/ML IJ SOLN: 25.0000 ug | INTRAMUSCULAR | Status: DC | PRN

## 2012-04-20 MED ORDER — DIPHENHYDRAMINE HCL 25 MG PO CAPS: 25.0000 mg | ORAL_CAPSULE | Freq: Four times a day (QID) | ORAL | Status: DC | PRN

## 2012-04-20 MED ORDER — NIFEDIPINE ER 30 MG PO TB24
30.0000 mg | ORAL_TABLET | Freq: Every day | ORAL | Status: DC
  Administered 2012-04-20 – 2012-04-22 (×3): 30 mg via ORAL
  Filled 2012-04-20 (×4): qty 1

## 2012-04-20 MED ORDER — ONDANSETRON HCL 4 MG/2ML IJ SOLN: 4.0000 mg | INTRAMUSCULAR | Status: DC | PRN

## 2012-04-20 MED ORDER — PHENYLEPHRINE HCL 10 MG/ML IJ SOLN
INTRAMUSCULAR | Status: DC | PRN
  Administered 2012-04-20 (×6): 80 ug via INTRAVENOUS

## 2012-04-20 MED ORDER — DIPHENHYDRAMINE HCL 25 MG PO CAPS
25.0000 mg | ORAL_CAPSULE | ORAL | Status: DC | PRN
  Administered 2012-04-21: 25 mg via ORAL
  Filled 2012-04-20 (×2): qty 1

## 2012-04-20 MED ORDER — MORPHINE SULFATE (PF) 0.5 MG/ML IJ SOLN
INTRAMUSCULAR | Status: DC | PRN
  Administered 2012-04-20: .1 mg via INTRATHECAL

## 2012-04-20 MED ORDER — EPHEDRINE 5 MG/ML INJ
INTRAVENOUS | Status: AC
  Filled 2012-04-20: qty 10

## 2012-04-20 MED ORDER — EPHEDRINE SULFATE 50 MG/ML IJ SOLN
INTRAMUSCULAR | Status: DC | PRN
  Administered 2012-04-20 (×2): 10 mg via INTRAVENOUS

## 2012-04-20 SURGICAL SUPPLY — 31 items
CLOTH BEACON ORANGE TIMEOUT ST (SAFETY) ×2 IMPLANT
DERMABOND ADVANCED (GAUZE/BANDAGES/DRESSINGS)
DERMABOND ADVANCED .7 DNX12 (GAUZE/BANDAGES/DRESSINGS) IMPLANT
DRAPE LG THREE QUARTER DISP (DRAPES) IMPLANT
DRESSING TELFA 8X3 (GAUZE/BANDAGES/DRESSINGS) IMPLANT
DRSG OPSITE POSTOP 4X10 (GAUZE/BANDAGES/DRESSINGS) ×2 IMPLANT
DURAPREP 26ML APPLICATOR (WOUND CARE) ×2 IMPLANT
ELECT REM PT RETURN 9FT ADLT (ELECTROSURGICAL) ×2
ELECTRODE REM PT RTRN 9FT ADLT (ELECTROSURGICAL) ×1 IMPLANT
EXTRACTOR VACUUM M CUP 4 TUBE (SUCTIONS) IMPLANT
GAUZE SPONGE 4X4 12PLY STRL LF (GAUZE/BANDAGES/DRESSINGS) IMPLANT
GLOVE BIO SURGEON STRL SZ7 (GLOVE) ×4 IMPLANT
GOWN PREVENTION PLUS LG XLONG (DISPOSABLE) ×4 IMPLANT
KIT ABG SYR 3ML LUER SLIP (SYRINGE) IMPLANT
NEEDLE HYPO 25X5/8 SAFETYGLIDE (NEEDLE) IMPLANT
NS IRRIG 1000ML POUR BTL (IV SOLUTION) ×2 IMPLANT
PACK C SECTION WH (CUSTOM PROCEDURE TRAY) ×2 IMPLANT
PAD ABD 7.5X8 STRL (GAUZE/BANDAGES/DRESSINGS) IMPLANT
PAD OB MATERNITY 4.3X12.25 (PERSONAL CARE ITEMS) ×2 IMPLANT
RTRCTR C-SECT PINK 25CM LRG (MISCELLANEOUS) ×2 IMPLANT
SLEEVE SCD COMPRESS KNEE MED (MISCELLANEOUS) ×2 IMPLANT
STAPLER VISISTAT 35W (STAPLE) IMPLANT
SUT CHROMIC 1 CTX 36 (SUTURE) ×6 IMPLANT
SUT PDS AB 0 CTX 60 (SUTURE) ×2 IMPLANT
SUT VIC AB 2-0 CT1 27 (SUTURE) ×1
SUT VIC AB 2-0 CT1 TAPERPNT 27 (SUTURE) ×1 IMPLANT
SUT VIC AB 2-0 CTX 36 (SUTURE) ×2 IMPLANT
SUT VIC AB 4-0 KS 27 (SUTURE) IMPLANT
TOWEL OR 17X24 6PK STRL BLUE (TOWEL DISPOSABLE) ×6 IMPLANT
TRAY FOLEY CATH 14FR (SET/KITS/TRAYS/PACK) ×2 IMPLANT
WATER STERILE IRR 1000ML POUR (IV SOLUTION) IMPLANT

## 2012-04-20 NOTE — Anesthesia Preprocedure Evaluation (Signed)
Anesthesia Evaluation  Patient identified by MRN, date of birth, ID band Patient awake    Reviewed: Allergy & Precautions, H&P , NPO status , Patient's Chart, lab work & pertinent test results  History of Anesthesia Complications (+) PONV  Airway Mallampati: III      Dental No notable dental hx.    Pulmonary neg pulmonary ROS,  breath sounds clear to auscultation  Pulmonary exam normal       Cardiovascular Exercise Tolerance: Good hypertension, negative cardio ROS  Rhythm:regular Rate:Normal     Neuro/Psych negative neurological ROS  negative psych ROS   GI/Hepatic negative GI ROS, Neg liver ROS,   Endo/Other  negative endocrine ROSdiabetes  Renal/GU negative Renal ROS  negative genitourinary   Musculoskeletal   Abdominal Normal abdominal exam  (+)   Peds  Hematology negative hematology ROS (+)   Anesthesia Other Findings HELLP  Reproductive/Obstetrics (+) Pregnancy                           Anesthesia Physical Anesthesia Plan  ASA: III and emergent  Anesthesia Plan: Spinal   Post-op Pain Management:    Induction:   Airway Management Planned:   Additional Equipment:   Intra-op Plan:   Post-operative Plan:   Informed Consent: I have reviewed the patients History and Physical, chart, labs and discussed the procedure including the risks, benefits and alternatives for the proposed anesthesia with the patient or authorized representative who has indicated his/her understanding and acceptance.     Plan Discussed with: Anesthesiologist, CRNA and Surgeon  Anesthesia Plan Comments:         Anesthesia Quick Evaluation

## 2012-04-20 NOTE — Anesthesia Postprocedure Evaluation (Signed)
Anesthesia Post Note  Patient: Brandi Blevins  Procedure(s) Performed: Procedure(s) (LRB): CESAREAN SECTION (N/A)  Anesthesia type: Spinal  Patient location: PACU  Post pain: Pain level controlled  Post assessment: Post-op Vital signs reviewed  Last Vitals:  Filed Vitals:   04/20/12 1645  BP:   Pulse:   Temp:   Resp: 16    Post vital signs: Reviewed  Level of consciousness: awake  Complications: No apparent anesthesia complications

## 2012-04-20 NOTE — Anesthesia Procedure Notes (Signed)
Spinal  Patient location during procedure: OR Start time: 04/20/2012 3:51 PM Staffing Anesthesiologist: Angus Seller., Harrell Gave. Performed by: anesthesiologist  Preanesthetic Checklist Completed: patient identified, site marked, surgical consent, pre-op evaluation, timeout performed, IV checked, risks and benefits discussed and monitors and equipment checked Spinal Block Patient position: sitting Prep: DuraPrep Patient monitoring: heart rate, cardiac monitor, continuous pulse ox and blood pressure Approach: midline Location: L3-4 Injection technique: single-shot Needle Needle type: Sprotte  Needle gauge: 24 G Needle length: 9 cm Assessment Sensory level: T4 Additional Notes Patient identified.  Risk benefits discussed including failed block, incomplete pain control, headache, nerve damage, paralysis, blood pressure changes, nausea, vomiting, reactions to medication both toxic or allergic, and postpartum back pain.  Patient expressed understanding and wished to proceed.  All questions were answered.  Sterile technique used throughout procedure.  CSF was clear.  No parasthesia or other complications.  Please see nursing notes for vital signs.

## 2012-04-20 NOTE — MAU Note (Signed)
Sent to hospital by OB due to labs and hypertension;

## 2012-04-20 NOTE — Consult Note (Signed)
Asked to provide prenatal consultation for  38 y.o. G1 mother with Type 1 diabetes (insulin pump) who is now [redacted] weeks EGA and will have C/section later today due to gestational hypertension.  Discussed usual expectations for preterm infant at [redacted] weeks gestation, including possible needs for DR resuscitation, respiratory support, IV access, and possible length of stay in NICU until 36 - [redacted] wks EGA.  She plans to pump and provide breast milk.  Patient's husband was present during discussion, both were attentive, had appropriate questions, and were appreciative of my input.  Thank you for the consultation.

## 2012-04-20 NOTE — Transfer of Care (Signed)
Immediate Anesthesia Transfer of Care Note  Patient: Brandi Blevins  Procedure(s) Performed: Procedure(s) (LRB) with comments: CESAREAN SECTION (N/A)  Patient Location: PACU  Anesthesia Type:Spinal  Level of Consciousness: awake, alert  and oriented  Airway & Oxygen Therapy: Patient Spontanous Breathing  Post-op Assessment: Report given to PACU RN and Post -op Vital signs reviewed and stable  Post vital signs: stable  Complications: No apparent anesthesia complications

## 2012-04-20 NOTE — Progress Notes (Signed)
0.3 units given via pt. Insulin pump. Pt. Signed the "patient contract for insulin pump therapy" and is maintaining appropriate log. Pt. States that her pump is not set for one single rate of basil insulin and that she will inform me and log what basal insulin is at the time of each bolus

## 2012-04-20 NOTE — H&P (Signed)
Brandi Blevins is a 37 y.o. female presenting for evaluation of elevated BPs  And abnormal labs  37 yo G1P0 @ 34 weeks who was seen in the office 1/7 for evaluation of elevated BPs.  Labwork showed LFTs in the 70s and a 24 hr urine was 2.8 grams.  BPs were 140s/90s.  Pt has IDDM and is on an insulin pump.  Blood sugars have been well controlled and a baseline 24 hr urine in early in the pregnancy was 110 mg.  History OB History    Grav Para Term Preterm Abortions TAB SAB Ect Mult Living   1 0 0 0 0 0 0 0 0 0      Past Medical History  Diagnosis Date  . Allergic rhinitis   . Hypertension     chronic HTN x2 yrs  . Diabetes mellitus     insulin dep  . Anxiety   . Complication of anesthesia   . PONV (postoperative nausea and vomiting)    Past Surgical History  Procedure Date  . Ankle surgery     x2  . Tonsillectomy   . Nasal sinus surgery   . Wisdom tooth extraction    Family History: family history is not on file. Social History:  reports that she quit smoking about 7 years ago. She has never used smokeless tobacco. She reports that she drinks alcohol. She reports that she does not use illicit drugs.   Prenatal Transfer Tool  Maternal Diabetes: Yes:  Diabetes Type:  Pre-pregnancy, Insulin/Medication controlled.  Pt is on an insulin pump, hgb A1C have been 6.1 Genetic Screening: Normal Maternal Ultrasounds/Referrals: Normal Fetal Ultrasounds or other Referrals:  Fetal echo Maternal Substance Abuse:  No Significant Maternal Medications:  Meds include: Other:  Insulin pump, Nifedipine 30mg  XL Significant Maternal Lab Results:  Lab values include: Other: 24 hr urine 2.8gm Other Comments:  None  ROS: as above    Blood pressure 186/115, pulse 98, temperature 98 F (36.7 C), temperature source Oral, resp. rate 16, height 5\' 2"  (1.575 m), weight 86.183 kg (190 lb), last menstrual period 08/22/2011. Exam Physical Exam  Prenatal labs: ABO, Rh:  A Pos Antibody:  Neg Rubella:   Imm RPR:   NR HBsAg:   Neg HIV:   NR GBS:  Not done   Assessment/Plan: 1) Admit 2) Serial BPS, recheck labs 3) Bedside U/S shows Breech presentation 4) Anticipate delivery today by cesarean section once labwork confirmed to be abnormal.  5) Magnesium sulfate for seizure prophylaxis 6) SCDs  Brandi Blevins H. 04/20/2012, 9:30 AM

## 2012-04-20 NOTE — Op Note (Signed)
Pre-Operative Diagnosis: 1) 34 week intrauterine pregnancy 2) HELLP Syndrome 3) Complete Breech Presentation 4) Insulin Dependent Diabetes Mellitus Postoperative Diagnosis: Same Procedure: Primary low transverse cesarean section Surgeon: Dr. Waynard Reeds Assistant: None Operative Findings: Vigorous female infant in vertex presentation. Normal appearing ovaries, tubes, and uterus. Weight 5 pounds 4.6 oz. Specimen: Placenta to pathology EBL: Total I/O In: 800 [I.V.:800] Out: 800 [Urine:100; Blood:700]   Procedure:Brandi Blevins is an 37 year old gravida 1 para 0 at 34 weeks and 0 days estimated gestational age who presents for cesarean section. The patient is a insulin-dependent diabetic who has had diabetes since she was age of 8. She has had excellent control throughout this pregnancy with her hemoglobin A1c is ranging between 6.1 and 6.6. She has been followed in the office and had been normotensive throughout the pregnancy until January 7 which was noted to have elevated blood pressures in the 140s/ 80s range with 1+ proteinuria. She does have some chronic underlying hypertension for which she is on nifedipine 30 mg XL daily. A baseline 24-hour urine in the pregnancy was 110 mg of protein. She was seen on January 9 and the office at which time she returned a 24-hour urine and had blood work drawn. Her blood pressures at that time were 150s over 100s with 2+ proteinuria. Her labs came back this morning with a 24-hour urine of 2.8 g of protein and her liver functions were elevated in the 70s. Given the development of HELLP syndrome the decision was made to proceed with delivery. A bedside ultrasound was performed and demonstrated fetal presenting part to be complete breech therefore the decision was made to proceed with cesarean section for delivery. Risks, benefits, and alternatives of the procedure were discussed at length with the patient and her husband and the appropriate informed consent was  obtained. Following the appropriate informed consent the patient was brought to the operating room where spinal anesthesia was administered and found to be adequate. She was placed in the dorsal supine position with a leftward tilt. She was prepped and draped in the normal sterile fashion. Scalpel was then used to make a Pfannenstiel skin incision which was carried down to the underlying layers of soft tissue to the fascia. The fascia was incised in the midline and the fascial incision was extended laterally with Mayo scissors. The superior aspect of the fascial incision was grasped with Coker clamps x2, tented up and the rectus muscles dissected off sharply with the electrocautery unit area and the same procedure was repeated on the inferior aspect of the fascial incision. The rectus muscles were separated in the midline. The abdominal peritoneum was identified, tented up, entered sharply, and the incision was extended superiorly and inferiorly with good visualization of the bladder. The Alexis retractor was then deployed. The vesicouterine peritoneum was identified, tented up, entered sharply, and the bladder flap was created digitally. Scalpel was then used to make a low transverse incision on the uterus which was extended laterally with blunt dissection. The fetal vertex was identified, delivered easily through the uterine incision followed by the body. The infant was bulb suctioned on the operative field cried vigorously, cord was clamped and cut and the infant was passed to the waiting neonatologist. Placenta was then delivered spontaneously, the uterus was cleared of all clot and debris. The uterine incision was repaired with #1 chromic in running locked fashion followed by a second imbricating layer. Ovaries and tubes were inspected and normal. The Alexis retractor was removed. The uterus was returned  to the abdominal cavity the abdominal cavity was cleared of all clot and debris. The abdominal peritoneum  was reapproximated with 2-0 Vicryl in a running fashion, the rectus muscles was reapproximated with #1 chromic in a running fashion. The fascia was closed with a looped PDS in a running fashion. The skin was closed with 4-0 vicryl in a subcuticular fashion and Dermabond. All sponge lap and needle counts were correct x2. Patient tolerated the procedure well and recovered in stable condition following the procedure.

## 2012-04-21 LAB — COMPREHENSIVE METABOLIC PANEL
Albumin: 1.9 g/dL — ABNORMAL LOW (ref 3.5–5.2)
Alkaline Phosphatase: 163 U/L — ABNORMAL HIGH (ref 39–117)
BUN: 15 mg/dL (ref 6–23)
Creatinine, Ser: 1.05 mg/dL (ref 0.50–1.10)
GFR calc Af Amer: 79 mL/min — ABNORMAL LOW (ref >90)
Glucose, Bld: 162 mg/dL — ABNORMAL HIGH (ref 70–99)
Total Bilirubin: 0.3 mg/dL (ref 0.3–1.2)
Total Protein: 4.3 g/dL — ABNORMAL LOW (ref 6.0–8.3)

## 2012-04-21 LAB — GLUCOSE, CAPILLARY
Glucose-Capillary: 145 mg/dL — ABNORMAL HIGH (ref 70–99)
Glucose-Capillary: 132 mg/dL — ABNORMAL HIGH (ref 70–99)
Glucose-Capillary: 172 mg/dL — ABNORMAL HIGH (ref 70–99)

## 2012-04-21 LAB — CBC
HCT: 24.6 % — ABNORMAL LOW (ref 36.0–46.0)
MCH: 29.2 pg (ref 26.0–34.0)
MCHC: 33.7 g/dL (ref 30.0–36.0)
MCV: 86.6 fL (ref 78.0–100.0)
RDW: 12.6 % (ref 11.5–15.5)

## 2012-04-21 LAB — MAGNESIUM: Magnesium: 5.7 mg/dL — ABNORMAL HIGH (ref 1.5–2.5)

## 2012-04-21 LAB — LACTATE DEHYDROGENASE: LDH: 335 U/L — ABNORMAL HIGH (ref 94–250)

## 2012-04-21 LAB — RPR: RPR Ser Ql: NONREACTIVE

## 2012-04-21 MED ORDER — OXYCODONE HCL 5 MG PO TABS
5.0000 mg | ORAL_TABLET | ORAL | Status: DC | PRN
  Administered 2012-04-21 – 2012-04-22 (×3): 5 mg via ORAL
  Filled 2012-04-21 (×3): qty 1

## 2012-04-21 MED ORDER — COMPLETENATE 29-1 MG PO CHEW
1.0000 | CHEWABLE_TABLET | Freq: Every day | ORAL | Status: DC
  Administered 2012-04-21 – 2012-04-24 (×4): 1 via ORAL
  Filled 2012-04-21 (×5): qty 1

## 2012-04-21 MED ORDER — SODIUM CHLORIDE 0.9 % IJ SOLN: 3.0000 mL | INTRAMUSCULAR | Status: DC | PRN

## 2012-04-21 MED ORDER — SODIUM CHLORIDE 0.9 % IJ SOLN
3.0000 mL | Freq: Two times a day (BID) | INTRAMUSCULAR | Status: DC
  Administered 2012-04-21 (×2): 3 mL via INTRAVENOUS

## 2012-04-21 MED ORDER — HYDROCORTISONE 1 % EX CREA
TOPICAL_CREAM | Freq: Three times a day (TID) | CUTANEOUS | Status: DC
  Administered 2012-04-21 – 2012-04-24 (×8): via TOPICAL
  Filled 2012-04-21: qty 28

## 2012-04-21 NOTE — Progress Notes (Signed)
2220 - 0010 with parents in NICU with baby. Pt. Tolerated all very well

## 2012-04-21 NOTE — Progress Notes (Signed)
Patient ID: Brandi Blevins, female   DOB: 1975-08-06, 37 y.o.   MRN: [id number]     S: Pain controlled, although does note soreness around incision and "internally".  Foley catheter still in place.  Denies Headaches or vision changes. O:  Filed Vitals:   04/21/12 0943 04/21/12 1000 04/21/12 1102 04/21/12 1203  BP:  137/79 112/56 114/61  Pulse: 80  77 75  Temp:    98.6 F (37 C)  TempSrc:    Oral  Resp: 18  18 18   Height:      Weight:      SpO2: 96%  96% 98%   AOx3, NAD Inc Dressing C/D/I  CBC    Component Value Date/Time   WBC 15.7* 04/21/2012 0510   RBC 2.84* 04/21/2012 0510   HGB 8.4* 04/21/2012 0510   HCT 24.6* 04/21/2012 0510   PLT 131* 04/21/2012 0510   MCV 86.6 04/21/2012 0510   MCH 29.2 04/21/2012 0510   MCHC 33.7 04/21/2012 0510   RDW 12.6 04/21/2012 0510   LYMPHSABS 2.0 04/20/2012 1036   MONOABS 0.7 04/20/2012 1036   EOSABS 0.1 04/20/2012 1036   BASOSABS 0.1 04/20/2012 1036    CMP     Component Value Date/Time   NA 127* 04/21/2012 0510   K 4.3 04/21/2012 0510   CL 96 04/21/2012 0510   CO2 23 04/21/2012 0510   GLUCOSE 162* 04/21/2012 0510   BUN 15 04/21/2012 0510   CREATININE 1.05 04/21/2012 0510   CALCIUM 6.8* 04/21/2012 0510   PROT 4.3* 04/21/2012 0510   ALBUMIN 1.9* 04/21/2012 0510   AST 56* 04/21/2012 0510   ALT 49* 04/21/2012 0510   ALKPHOS 163* 04/21/2012 0510   BILITOT 0.3 04/21/2012 0510   GFRNONAA 68* 04/21/2012 0510   GFRAA 79* 04/21/2012 0510    CBGs 120-140  A/P 1) Resolving HELLP Syndrome: Mag to be discontinued at 13:49.  Will saline lock ivf at that time.  D/C foley then also. 2) Baby in NICU doing well.  Feeding tube placed d/t tachypnea but not currently on supplemental O2 3) Pt pumping 4) IDDM: Pt has been in contact with Endocrinologist since delivery.  Pump basal settings have been halved.  Currently checking BS before eating and after eating.  Pt boluses self with pump. 5) Anticipate transfer out of the ICU 4 hours after mag D/C'd.  Will evaluated  BPs after mag d/cd before transfer

## 2012-04-22 LAB — GLUCOSE, CAPILLARY
Glucose-Capillary: 163 mg/dL — ABNORMAL HIGH (ref 70–99)
Glucose-Capillary: 152 mg/dL — ABNORMAL HIGH (ref 70–99)
Glucose-Capillary: 98 mg/dL (ref 70–99)

## 2012-04-22 MED ORDER — NIFEDIPINE ER 60 MG PO TB24
60.0000 mg | ORAL_TABLET | Freq: Every day | ORAL | Status: DC
  Administered 2012-04-23 – 2012-04-24 (×2): 60 mg via ORAL
  Filled 2012-04-22 (×3): qty 1

## 2012-04-22 MED ORDER — HYDROMORPHONE HCL 2 MG PO TABS
4.0000 mg | ORAL_TABLET | ORAL | Status: DC | PRN
  Administered 2012-04-22 – 2012-04-24 (×11): 4 mg via ORAL
  Filled 2012-04-22 (×6): qty 2
  Filled 2012-04-22: qty 1
  Filled 2012-04-22 (×4): qty 2
  Filled 2012-04-22: qty 1

## 2012-04-22 MED ORDER — NIFEDIPINE ER 30 MG PO TB24
30.0000 mg | ORAL_TABLET | Freq: Once | ORAL | Status: AC
  Administered 2012-04-22: 30 mg via ORAL
  Filled 2012-04-22: qty 1

## 2012-04-22 NOTE — Progress Notes (Signed)
Patient ID: Brandi Blevins, female   DOB: [birth year], 37 y.o.   MRN: [id number]   S: Pain poorly controlled with percocet and oxycodone for breakthrough.  BPs more elevated this AM.  BS running a little high O: Filed Vitals:   04/21/12 2207 04/22/12 0129 04/22/12 0602 04/22/12 0626  BP:  135/71 159/83   Pulse:  85 79   Temp: 98.8 F (37.1 C) 99.6 F (37.6 C)  98 F (36.7 C)  TempSrc: Oral Oral  Axillary  Resp:  16 19   Height:      Weight:   85.078 kg (187 lb 9 oz)   SpO2:  94% 96%    AOX3, Uncomfortable with movement Abd soft, appropriately tender Inc C/D/I.  Some blood staining at left hand aspect but old. SCDs in place  A/P 1) Resolving HELLP. Will recheck labs in the morning 2) Pain control. Will Add Dilaudid 4mg  Q 4 hrs.  If controls pain better than percocet & oxycodone will d/c those. 3) Monitor BPs.  Continue Nifedipine 30mg  XL.  May need to bump up if BPs remain elevated. 4) Continue monitoring BS.  Will see if improved pain control improves BS.  If not may need to increase basal rates slightly.

## 2012-04-23 ENCOUNTER — Encounter (HOSPITAL_COMMUNITY): Payer: Self-pay | Admitting: Obstetrics and Gynecology

## 2012-04-23 LAB — CBC
Hemoglobin: 9.8 g/dL — ABNORMAL LOW (ref 12.0–15.0)
MCHC: 34.1 g/dL (ref 30.0–36.0)
Platelets: 195 10*3/uL (ref 150–400)
RBC: 3.25 MIL/uL — ABNORMAL LOW (ref 3.87–5.11)

## 2012-04-23 LAB — COMPREHENSIVE METABOLIC PANEL
ALT: 45 U/L — ABNORMAL HIGH (ref 0–35)
AST: 58 U/L — ABNORMAL HIGH (ref 0–37)
Alkaline Phosphatase: 158 U/L — ABNORMAL HIGH (ref 39–117)
Calcium: 8.1 mg/dL — ABNORMAL LOW (ref 8.4–10.5)
GFR calc Af Amer: >90 mL/min (ref >90)
Glucose, Bld: 88 mg/dL (ref 70–99)
Potassium: 4.1 mEq/L (ref 3.5–5.1)
Sodium: 137 mEq/L (ref 135–145)
Total Protein: 5.1 g/dL — ABNORMAL LOW (ref 6.0–8.3)

## 2012-04-23 LAB — GLUCOSE, CAPILLARY: Glucose-Capillary: 81 mg/dL (ref 70–99)

## 2012-04-23 MED ORDER — LABETALOL HCL 200 MG PO TABS
200.0000 mg | ORAL_TABLET | Freq: Two times a day (BID) | ORAL | Status: DC
  Administered 2012-04-23: 200 mg via ORAL
  Filled 2012-04-23 (×3): qty 1

## 2012-04-23 MED ORDER — BISACODYL 10 MG RE SUPP
10.0000 mg | Freq: Every day | RECTAL | Status: DC | PRN
  Administered 2012-04-23: 10 mg via RECTAL
  Filled 2012-04-23 (×2): qty 1

## 2012-04-23 MED ORDER — LABETALOL HCL 200 MG PO TABS
200.0000 mg | ORAL_TABLET | Freq: Once | ORAL | Status: AC
  Administered 2012-04-23: 200 mg via ORAL
  Filled 2012-04-23: qty 1

## 2012-04-23 MED ORDER — LABETALOL HCL 200 MG PO TABS
200.0000 mg | ORAL_TABLET | Freq: Three times a day (TID) | ORAL | Status: DC
  Administered 2012-04-23 – 2012-04-24 (×3): 200 mg via ORAL
  Filled 2012-04-23 (×6): qty 1

## 2012-04-23 MED ORDER — BISACODYL 10 MG RE SUPP: 10.0000 mg | Freq: Every day | RECTAL | Status: DC | PRN

## 2012-04-23 NOTE — Progress Notes (Signed)
Ur chart review completed.  

## 2012-04-23 NOTE — Progress Notes (Signed)
POD#3 Pt worried that her B/Ps are still in good control. Baby is doing well. VS- B/Ps are labile. Pt now on Procardia XL 60mg  and Labetalol 200mg  BID.       PIH labs are essentially unchanged from yesterday. PE- Abd- incision is healing well. Fundus- appropriately tender.  IMP/ POD#3 with HELLP.          Labile B/Ps Plan/ Will increase Labetalol to 200 tid and follow. Hopefully will be able to discharge in am.

## 2012-04-23 NOTE — Clinical Social Work Maternal (Signed)
    Clinical Social Work Department PSYCHOSOCIAL ASSESSMENT - MATERNAL/CHILD 04/23/2012  Patient:  DIONDRA, PINES  Account Number:  [id number]  Admit Date:  04/20/2012  Marjo Bicker Name:   Louie Bun    Clinical Social Worker:  Lulu Riding, LCSW   Date/Time:  04/23/2012 11:00 AM  Date Referred:  04/23/2012   Referral source  NICU     Referred reason  NICU   Other referral source:    I:  FAMILY / HOME ENVIRONMENT Child's legal guardian:  PARENT  Guardian - Name Guardian - Age Guardian - Address  Daenerys Buttram [address] [place], [place] [address]  Rosalita Levan  same   Other household support members/support persons Other support:   Good support system    II  PSYCHOSOCIAL DATA Information Source:  Family Interview  Financial and [employer] Employment:   FOB manages affordable housing for elderly  MOB works at the [employer].   Financial resources:  Media planner If [employer] - [place]:    School / Grade:   Maternity Care Coordinator / Child Services Coordination / Early Interventions:  Cultural issues impacting care:   None identified    III  STRENGTHS Strengths  Adequate Resources  Compliance with medical plan  Home prepared for Child (including basic supplies)  Other - See comment  Supportive family/friends  Understanding of illness   Strength comment:  Pediatric follow up will be with Dr. Antony Haste, who is a friend of FOB.   IV  RISK FACTORS AND CURRENT PROBLEMS Current Problem:  None     V  SOCIAL WORK ASSESSMENT CSW met with parents in MOB's third floor room/306 to introduce myseld, complete assessment and evaluate how they are coping with baby's admission to NICU.  Parents were very friendly and seemed to be appreciative of CSW's visit. CSW discussed what to expect from a NICU admission in general terms and signs and symptoms of PPD.  CSW explained support services offered by NICU CSW.  Parents state they have  everything they need for baby at home and good supports.  They appreciate the care they have been receiving and seems to have a good understanding of the situation.  They have no questions or concerns at this time.  CSW has no social concerns at this time.      VI SOCIAL WORK PLAN Social Work Plan  No Further Intervention Required / No Barriers to Discharge   Type of pt/family education:   PPD  What to expect from a NICU admission   If child protective services report - county:   If child protective services report - date:   Information/referral to community resources comment:   No referral needs identified at this time.   Other social work plan:

## 2012-04-24 ENCOUNTER — Encounter (HOSPITAL_COMMUNITY)
Admit: 2012-04-24 | Discharge: 2012-04-24 | Disposition: A | Discharge status: Home / Self Care | Payer: Federal, State, Local not specified - PPO | Attending: Obstetrics and Gynecology | Admitting: Obstetrics and Gynecology

## 2012-04-24 MED ORDER — LABETALOL HCL 200 MG PO TABS: 200.0000 mg | ORAL_TABLET | Freq: Three times a day (TID) | ORAL | Status: DC

## 2012-04-24 MED ORDER — NIFEDIPINE ER 60 MG PO TB24: 60.0000 mg | ORAL_TABLET | Freq: Every day | ORAL | Status: DC

## 2012-04-24 MED ORDER — HYDROMORPHONE HCL 4 MG PO TABS: 4.0000 mg | ORAL_TABLET | ORAL | Status: DC | PRN

## 2012-04-24 NOTE — Discharge Summary (Signed)
Obstetric Discharge Summary Reason for Admission: observation/evaluation for preeclampsia at 34 weeks. Prenatal Procedures: Preeclampsia Intrapartum Procedures: cesarean: low cervical, transverse (Breech presentation) Postpartum Procedures: Antihypertensive therapy Complications-Operative and Postpartum: none Hemoglobin  Date Value Range Status  04/23/2012 9.8* 12.0 - 15.0 g/dL Final     HCT  Date Value Range Status  04/23/2012 28.7* 36.0 - 46.0 % Final    Physical Exam:  BP: 150/90 range General: alert Lochia: appropriate Uterine Fundus: firm Incision: healing well  Discharge Diagnoses: Preelampsia, Late preterm (34 weeks) pregnancy delivered, Breech presentation  Discharge Information: Date: 04/24/2012 Activity: Limited Diet: routine Medications: PNV, Dilaudid, Procardia, Labetalol. Condition: stable Instructions: refer to practice specific booklet Discharge to: home Follow-up Information    Follow up with Almon Hercules., MD. Schedule an appointment as soon as possible for a visit in 1 week.   Contact information:   [address] [place] [address] [phone]          Newborn Data: Live born female  Birth Weight: 5 lb 4.6 oz (2398 g) APGAR: 8, 8  Baby doing well in NICU.  Chang Tiggs D 04/24/2012, 9:21 AM

## 2012-04-24 NOTE — Progress Notes (Signed)
Patient discharged home.  Patient and husband verbalized understanding of discharge instructions.  Patient ambulated to NICU to visit baby before going home.

## 2012-05-05 ENCOUNTER — Emergency Department (HOSPITAL_COMMUNITY): Payer: Federal, State, Local not specified - PPO

## 2012-05-05 ENCOUNTER — Emergency Department (HOSPITAL_COMMUNITY)
Admission: EM | Admit: 2012-05-05 | Discharge: 2012-05-05 | Disposition: A | Discharge status: Home / Self Care | Payer: Federal, State, Local not specified - PPO | Attending: Emergency Medicine | Admitting: Emergency Medicine

## 2012-05-05 ENCOUNTER — Encounter (HOSPITAL_COMMUNITY): Payer: Self-pay | Admitting: Emergency Medicine

## 2012-05-05 DIAGNOSIS — W19XXXA Unspecified fall, initial encounter: Secondary | ICD-10-CM | POA: Insufficient documentation

## 2012-05-05 DIAGNOSIS — Z79899 Other long term (current) drug therapy: Secondary | ICD-10-CM | POA: Insufficient documentation

## 2012-05-05 DIAGNOSIS — Z87891 Personal history of nicotine dependence: Secondary | ICD-10-CM | POA: Insufficient documentation

## 2012-05-05 DIAGNOSIS — E162 Hypoglycemia, unspecified: Secondary | ICD-10-CM

## 2012-05-05 DIAGNOSIS — S82409A Unspecified fracture of shaft of unspecified fibula, initial encounter for closed fracture: Secondary | ICD-10-CM | POA: Insufficient documentation

## 2012-05-05 DIAGNOSIS — R55 Syncope and collapse: Secondary | ICD-10-CM | POA: Insufficient documentation

## 2012-05-05 DIAGNOSIS — Y9229 Other specified public building as the place of occurrence of the external cause: Secondary | ICD-10-CM | POA: Insufficient documentation

## 2012-05-05 DIAGNOSIS — I10 Essential (primary) hypertension: Secondary | ICD-10-CM | POA: Insufficient documentation

## 2012-05-05 DIAGNOSIS — Z8659 Personal history of other mental and behavioral disorders: Secondary | ICD-10-CM | POA: Insufficient documentation

## 2012-05-05 DIAGNOSIS — Z794 Long term (current) use of insulin: Secondary | ICD-10-CM | POA: Insufficient documentation

## 2012-05-05 DIAGNOSIS — E1169 Type 2 diabetes mellitus with other specified complication: Secondary | ICD-10-CM | POA: Insufficient documentation

## 2012-05-05 DIAGNOSIS — Y9389 Activity, other specified: Secondary | ICD-10-CM | POA: Insufficient documentation

## 2012-05-05 DIAGNOSIS — S82402A Unspecified fracture of shaft of left fibula, initial encounter for closed fracture: Secondary | ICD-10-CM

## 2012-05-05 LAB — GLUCOSE, CAPILLARY: Glucose-Capillary: 260 mg/dL — ABNORMAL HIGH (ref 70–99)

## 2012-05-05 LAB — COMPREHENSIVE METABOLIC PANEL
ALT: 43 U/L — ABNORMAL HIGH (ref 0–35)
AST: 46 U/L — ABNORMAL HIGH (ref 0–37)
Albumin: 3 g/dL — ABNORMAL LOW (ref 3.5–5.2)
Alkaline Phosphatase: 103 U/L (ref 39–117)
BUN: 12 mg/dL (ref 6–23)
CO2: 17 mEq/L — ABNORMAL LOW (ref 19–32)
Calcium: 8.7 mg/dL (ref 8.4–10.5)
Chloride: 101 mEq/L (ref 96–112)
Creatinine, Ser: 0.69 mg/dL (ref 0.50–1.10)
GFR calc Af Amer: >90 mL/min (ref >90)
GFR calc non Af Amer: >90 mL/min (ref >90)
Glucose, Bld: 205 mg/dL — ABNORMAL HIGH (ref 70–99)
Potassium: 4.5 mEq/L (ref 3.5–5.1)
Sodium: 132 mEq/L — ABNORMAL LOW (ref 135–145)
Total Bilirubin: 0.3 mg/dL (ref 0.3–1.2)
Total Protein: 6.4 g/dL (ref 6.0–8.3)

## 2012-05-05 LAB — URINALYSIS, ROUTINE W REFLEX MICROSCOPIC
Bilirubin Urine: NEGATIVE
Glucose, UA: 500 mg/dL — AB
Ketones, ur: NEGATIVE mg/dL
Nitrite: NEGATIVE
Protein, ur: NEGATIVE mg/dL
Specific Gravity, Urine: 1.016 (ref 1.005–1.030)
Urobilinogen, UA: 0.2 mg/dL (ref 0.0–1.0)
pH: 6 (ref 5.0–8.0)

## 2012-05-05 LAB — CBC
HCT: 31.9 % — ABNORMAL LOW (ref 36.0–46.0)
Hemoglobin: 10.5 g/dL — ABNORMAL LOW (ref 12.0–15.0)
MCH: 28.8 pg (ref 26.0–34.0)
MCHC: 32.9 g/dL (ref 30.0–36.0)
MCV: 87.4 fL (ref 78.0–100.0)
Platelets: 492 10*3/uL — ABNORMAL HIGH (ref 150–400)
RBC: 3.65 MIL/uL — ABNORMAL LOW (ref 3.87–5.11)
RDW: 12.7 % (ref 11.5–15.5)
WBC: 14.3 10*3/uL — ABNORMAL HIGH (ref 4.0–10.5)

## 2012-05-05 LAB — URINE MICROSCOPIC-ADD ON

## 2012-05-05 MED ORDER — OXYCODONE-ACETAMINOPHEN 5-325 MG PO TABS
1.0000 | ORAL_TABLET | Freq: Once | ORAL | Status: AC
  Administered 2012-05-05: 1 via ORAL
  Filled 2012-05-05: qty 1

## 2012-05-05 NOTE — ED Notes (Signed)
Per Otila Kluver, PA call orthopedic tech & done.

## 2012-05-05 NOTE — ED Provider Notes (Addendum)
History     CSN: [id number]  Arrival date & time 05/05/12  1418   First MD Initiated Contact with Patient 05/05/12 1419      Chief Complaint  Patient presents with  . Hypoglycemia    (Consider location/radiation/quality/duration/timing/severity/associated sxs/prior treatment) HPI Patient presents to the emergency department following a hypoglycemic episode, where she passed out at [employer].  Patient, states she only ate toast for breakfast.  Patient's blood sugar issues since the birth of her baby and starting breast-feeding.  Patient denies chest pain, shortness breath, nausea, vomiting, diarrhea, dysuria, abdominal pain, neck pain, back pain, fever, or headache.  Patient, states she also hurt her ankle in the fall.  Past Medical History  Diagnosis Date  . Allergic rhinitis   . Hypertension     chronic HTN x2 yrs  . Diabetes mellitus     insulin dep  . Anxiety   . Complication of anesthesia   . PONV (postoperative nausea and vomiting)     Past Surgical History  Procedure Date  . Ankle surgery     x2  . Tonsillectomy   . Nasal sinus surgery   . Wisdom tooth extraction   . Cesarean section 04/20/2012    Procedure: CESAREAN SECTION;  Surgeon: Freddrick March. Tenny Craw, MD;  Location: [facility];  Service: Obstetrics;  Laterality: N/A;    Family History  Problem Relation Age of Onset  . Other Neg Hx     History  Substance Use Topics  . Smoking status: Former Smoker    Quit date: 04/11/2005  . Smokeless tobacco: Never Used  . Alcohol Use: No     Comment: 1-2 glasses of wine 3-4 days/week    OB History    Grav Para Term Preterm Abortions TAB SAB Ect Mult Living   1 1 0 1 0 0 0 0 0 1       Review of Systems All other systems negative except as documented in the HPI. All pertinent positives and negatives as reviewed in the HPI.  Allergies  Advil and Naproxen  Home Medications   Current Outpatient Rx  Name  Route  Sig  Dispense  Refill  . RHINOCORT AQUA NA   Nasal   Place  1 spray into the nose every other day.          . CEPHALEXIN 500 MG PO CAPS   Oral   Take 500 mg by mouth 4 (four) times daily. Take for 7 days         . HYDROMORPHONE HCL 4 MG PO TABS   Oral   Take 1 tablet (4 mg total) by mouth every 4 (four) hours as needed.   30 tablet   0   . INSULIN ASPART 100 UNIT/ML [place] SOLN   Subcutaneous   Inject into the skin continuous. Via insulin pump         . INSULIN PUMP   Subcutaneous   Inject 1 each into the skin as needed. novolog insulin         . LABETALOL HCL 200 MG PO TABS   Oral   Take 1 tablet (200 mg total) by mouth 3 (three) times daily.   90 tablet   2   . LEVOCETIRIZINE DIHYDROCHLORIDE 5 MG PO TABS   Oral   Take 5 mg by mouth every evening.          Marland Kitchen NIFEDIPINE ER 60 MG PO TB24   Oral   Take 1 tablet (60 mg total) by  mouth daily.   30 tablet   2   . PRENATAL MULTIVITAMIN CH   Oral   Take 1 tablet by mouth daily.           BP 146/90  Pulse 67  Temp 97.8 F (36.6 C) (Oral)  SpO2 100%  Breastfeeding? Yes  Physical Exam  Constitutional: She is oriented to person, place, and time. She appears well-developed and well-nourished. No distress.  HENT:  Head: Normocephalic and atraumatic.  Mouth/Throat: Oropharynx is clear and moist. No oropharyngeal exudate.  Eyes: Pupils are equal, round, and reactive to light.  Neck: Normal range of motion. Neck supple.  Cardiovascular: Normal rate, regular rhythm and normal heart sounds.  Exam reveals no gallop and no friction rub.   No murmur heard. Pulmonary/Chest: Effort normal and breath sounds normal. No respiratory distress.  Abdominal: Soft. Bowel sounds are normal. She exhibits no distension. There is no tenderness. There is no guarding.  Musculoskeletal:       Left ankle: She exhibits swelling and ecchymosis. She exhibits normal range of motion, no laceration and normal pulse. Achilles tendon normal.       Feet:  Neurological: She is alert and oriented to  person, place, and time.  Skin: Skin is warm and dry. No rash noted.    ED Course  Procedures (including critical care time)  Labs Reviewed  CBC - Abnormal; Notable for the following:    WBC 14.3 (*)     RBC 3.65 (*)     Hemoglobin 10.5 (*)     HCT 31.9 (*)     Platelets 492 (*)     All other components within normal limits  URINALYSIS, ROUTINE W REFLEX MICROSCOPIC - Abnormal; Notable for the following:    APPearance CLOUDY (*)     Glucose, UA 500 (*)     Hgb urine dipstick LARGE (*)     Leukocytes, UA SMALL (*)     All other components within normal limits  COMPREHENSIVE METABOLIC PANEL - Abnormal; Notable for the following:    Sodium 132 (*)     CO2 17 (*)     Glucose, Bld 205 (*)     Albumin 3.0 (*)     AST 46 (*)     ALT 43 (*)     All other components within normal limits  GLUCOSE, CAPILLARY - Abnormal; Notable for the following:    Glucose-Capillary 260 (*)     All other components within normal limits  URINE MICROSCOPIC-ADD ON   Dg Ankle Complete Left  05/05/2012  *RADIOLOGY REPORT*  Clinical Data: Larey Seat and injured left ankle, lateral pain and swelling.  LEFT ANKLE COMPLETE - 3+ VIEW  Comparison: None.  Findings: Comminuted, mildly displaced oblique fracture through the distal fibula.  Lucency involving the posterolateral talar dome. No other fractures.  Very slight anterior subluxation of the talus relative to the tibia.  Well-preserved joint space.  Joint effusion/hemarthrosis.  IMPRESSION:  1.  Comminuted, mildly displaced oblique fracture involving the distal fibula. 2.  Possible nondisplaced fracture involving the posteromedial talar dome. 3.  Slight anterior subluxation of the talus relative to the tibia.   Original Report Authenticated By: Hulan Saas, M.D.     3:30 PM patient rechecked and remained stable and mentating in appropriate fashion.  She has a fracture of the distal fibula.  Patient has had previous injury to that foot and ankle in the past  5 PM.   Patient, again rechecked and still continues to  be stable here in the emergency department  6:15 PM.  Patient is given a plan and is, advised she'll need to follow with her orthopedist on Monday  MDM  MDM Reviewed: nursing note and vitals Interpretation: labs and x-ray Consults: orthopedics   Spoke with ortho and they will follow up with the fracture.          Carlyle Dolly, PA-C 05/05/12 1836  Carlyle Dolly, PA-C 05/16/12 1517

## 2012-05-05 NOTE — ED Notes (Signed)
Patient transported to X-ray 

## 2012-05-05 NOTE — ED Notes (Signed)
POCT CBG done by patient (per her request)

## 2012-05-05 NOTE — ED Notes (Signed)
[id number]<AV> Expected date:05/05/12<BR> Expected time: 1:55 PM<BR> Means of arrival:Ambulance<BR> Comments:<BR> Left ankle injury

## 2012-05-05 NOTE — ED Notes (Signed)
PA is aware of insulin pump still @ basel rate & ok with it still infusing. Glucose 189mg /dl.

## 2012-05-05 NOTE — ED Provider Notes (Signed)
37 year old female with a history of type I insulin requiring diabetes who presents after having a hypoglycemic seizure while in a shopping store. She was given oral sugar and a meal and has improved back to her baseline. She had injured her left ankle during the fall but does not have any tongue biting, urinary incontinence or other injuries or pain at this time. She has recently had a baby, is breast-feeding has had trouble with intermittent hypoglycemia since that time. She uses an insulin pump which she states has been working normally.  On exam she has tenderness to the left ankle laterally over the lateral malleolus, soft abdomen, clear heart and lungs, normal mental status, normal neurologic exam.  Imaging to rule out fracture, continue to monitor blood sugar, feed, otherwise stable appearing at this time   Medical screening examination/treatment/procedure(s) were conducted as a shared visit with non-physician practitioner(s) and myself.  I personally evaluated the patient during the encounter    Vida Roller, MD 05/05/12 [phone]

## 2012-05-05 NOTE — ED Notes (Signed)
Sugar 235mg /dl via insulin pump.

## 2012-05-05 NOTE — ED Notes (Signed)
While shopping @ [employer] noted sugar dropping, felt shaky, dizzy & lightheaded then passed out came to noted several people around her trying to help her. When LOC improved noted left ankle pain swelling present told she had a seizure.  No urinary incontinence, no lacerations to tongue noted.   Last time sugar dropped to the point it caused her to have a seizure was 14 years ago.

## 2012-05-07 LAB — GLUCOSE, CAPILLARY: Glucose-Capillary: 104 mg/dL — ABNORMAL HIGH (ref 70–99)

## 2012-05-07 NOTE — ED Provider Notes (Signed)
Medical screening examination/treatment/procedure(s) were conducted as a shared visit with non-physician practitioner(s) and myself.  I personally evaluated the patient during the encounter  Please see my separate respective documentation pertaining to this patient encounter   Jenniger Figiel D Caylin Nass, MD 05/07/12 1125 

## 2012-05-17 NOTE — ED Provider Notes (Signed)
Medical screening examination/treatment/procedure(s) were conducted as a shared visit with non-physician practitioner(s) and myself.  I personally evaluated the patient during the encounter  Please see my separate respective documentation pertaining to this patient encounter   Vida Roller, MD 05/17/12 1059

## 2012-05-25 ENCOUNTER — Encounter (HOSPITAL_COMMUNITY)
Admission: RE | Admit: 2012-05-25 | Discharge: 2012-05-25 | Disposition: A | Discharge status: Home / Self Care | Payer: Federal, State, Local not specified - PPO | Source: Ambulatory Visit | Attending: Obstetrics and Gynecology | Admitting: Obstetrics and Gynecology

## 2012-05-25 DIAGNOSIS — O923 Agalactia: Secondary | ICD-10-CM | POA: Insufficient documentation

## 2012-06-29 ENCOUNTER — Encounter (INDEPENDENT_AMBULATORY_CARE_PROVIDER_SITE_OTHER): Payer: Federal, State, Local not specified - PPO | Admitting: Ophthalmology

## 2012-06-29 DIAGNOSIS — H43819 Vitreous degeneration, unspecified eye: Secondary | ICD-10-CM

## 2012-06-29 DIAGNOSIS — E11319 Type 2 diabetes mellitus with unspecified diabetic retinopathy without macular edema: Secondary | ICD-10-CM

## 2012-06-29 DIAGNOSIS — E1165 Type 2 diabetes mellitus with hyperglycemia: Secondary | ICD-10-CM

## 2012-06-29 DIAGNOSIS — E1139 Type 2 diabetes mellitus with other diabetic ophthalmic complication: Secondary | ICD-10-CM

## 2012-11-27 ENCOUNTER — Other Ambulatory Visit: Payer: Self-pay | Admitting: Obstetrics and Gynecology

## 2012-12-28 ENCOUNTER — Ambulatory Visit (INDEPENDENT_AMBULATORY_CARE_PROVIDER_SITE_OTHER): Payer: Federal, State, Local not specified - PPO | Admitting: Ophthalmology

## 2012-12-31 ENCOUNTER — Ambulatory Visit (INDEPENDENT_AMBULATORY_CARE_PROVIDER_SITE_OTHER): Payer: Federal, State, Local not specified - PPO | Admitting: Ophthalmology

## 2013-01-23 ENCOUNTER — Ambulatory Visit (INDEPENDENT_AMBULATORY_CARE_PROVIDER_SITE_OTHER): Payer: Federal, State, Local not specified - PPO | Admitting: Ophthalmology

## 2013-01-23 DIAGNOSIS — E1039 Type 1 diabetes mellitus with other diabetic ophthalmic complication: Secondary | ICD-10-CM

## 2013-01-23 DIAGNOSIS — H43819 Vitreous degeneration, unspecified eye: Secondary | ICD-10-CM

## 2013-01-23 DIAGNOSIS — I10 Essential (primary) hypertension: Secondary | ICD-10-CM

## 2013-01-23 DIAGNOSIS — H35039 Hypertensive retinopathy, unspecified eye: Secondary | ICD-10-CM

## 2013-01-23 DIAGNOSIS — E11319 Type 2 diabetes mellitus with unspecified diabetic retinopathy without macular edema: Secondary | ICD-10-CM

## 2013-01-23 DIAGNOSIS — H2510 Age-related nuclear cataract, unspecified eye: Secondary | ICD-10-CM

## 2013-03-11 DIAGNOSIS — M65312 Trigger thumb, left thumb: Secondary | ICD-10-CM

## 2013-03-11 HISTORY — DX: Trigger thumb, left thumb: M65.312

## 2013-04-02 ENCOUNTER — Other Ambulatory Visit: Payer: Self-pay | Admitting: Orthopedic Surgery

## 2013-04-02 ENCOUNTER — Encounter (HOSPITAL_BASED_OUTPATIENT_CLINIC_OR_DEPARTMENT_OTHER): Payer: Self-pay | Admitting: *Deleted

## 2013-04-02 DIAGNOSIS — S60511A Abrasion of right hand, initial encounter: Secondary | ICD-10-CM

## 2013-04-02 HISTORY — DX: Abrasion of right hand, initial encounter: S60.511A

## 2013-04-07 NOTE — H&P (Signed)
Brandi Blevins is an 37 y.o. female.   Chief Complaint: Left thumb pain and locking  HPI: Patient is seen for a left trigger thumb that is now locked in extension.  It is bothering her on off for the last couple of months, but for the last 3 days.  She is benign able to flex the thumb.  She had a similar problem to the right thumb back in 2011 that did not respond to cortisone injection with Dr. Ave Filter and she underwent trigger thumb release and did well.  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  She would specifically like to have this done under a straight local anesthetic with no IV, she does not want to try cortisone injection because they simply spike her blood sugar and do not work.  Past Medical History  Diagnosis Date  . Anxiety   . Complication of anesthesia   . PONV (postoperative nausea and vomiting)   . History of seizures     due to low blood sugar  . Seasonal allergies   . Hypertension     under control with med., has been on med. x 1 1/2 yr.  . IDDM (insulin dependent diabetes mellitus)     on insulin pump  . Poor intravenous access   . Trigger thumb of left hand 03/2013  . Poor sleep pattern     sleep study done 09/2009 - AHI 0.2/hr.  . Family history of anesthesia complication     pt's mother has hx. of post-op N/V  . Abrasion of right hand 04/02/2013    Past Surgical History  Procedure Laterality Date  . Ankle surgery Left     x 2  . Tonsillectomy and adenoidectomy    . Nasal septum surgery    . Wisdom tooth extraction    . Cesarean section  04/20/2012    Procedure: CESAREAN SECTION;  Surgeon: Freddrick March. Tenny Craw, MD;  Location: [facility];  Service: Obstetrics;  Laterality: N/A;    Family History  Problem Relation Age of Onset  . Anesthesia problems Mother     post-op N/V   Social History:  reports that she quit smoking about 7 years ago. She has never used smokeless tobacco. She reports that she drinks alcohol. She reports that  she does not use illicit drugs.  Allergies:  Allergies  Allergen Reactions  . Advil [Ibuprofen] Hives    No prescriptions prior to admission    No results found for this or any previous visit (from the past 48 hour(s)). No results found.  Review of Systems  Constitutional: Negative.   HENT: Negative.   Eyes: Negative.   Respiratory: Negative.   Cardiovascular: Negative.   Gastrointestinal: Negative.   Genitourinary: Negative.   Musculoskeletal: Positive for joint pain.  Skin: Negative.   Neurological: Negative.   Endo/Heme/Allergies: Negative.   Psychiatric/Behavioral: Negative.     Height 5\' 2"  (1.575 m), weight 72.576 kg (160 lb), last menstrual period 03/12/2013, unknown if currently breastfeeding. Physical Exam  Constitutional: She is oriented to person, place, and time. She appears well-developed and well-nourished.  HENT:  Head: Normocephalic and atraumatic.  Eyes: Pupils are equal, round, and reactive to light.  Neck: Normal range of motion.  Cardiovascular: Intact distal pulses.   Respiratory: Effort normal.  Musculoskeletal:  The right thumb is a full range of motion of the left thumb is stuck and extended position she is very tender over the A1 pulley and there is a palpable  nodule she is neurovascularly intact with normal pulses and sensation of the fingertips.    Neurological: She is alert and oriented to person, place, and time.  Skin: Skin is warm and dry.  Psychiatric: She has a normal mood and affect. Her behavior is normal. Judgment and thought content normal.     Assessment/Plan  Assess:.  Left trigger thumb locked in full extension, history of diabetes with an insulin pump and bad veins.  Plan: Risks and benefits of trigger thumb release were discussed and reinforced.  We will get her set up for left trigger thumb release under straight local, she'll have 0.5 mg, Xanax tablets to take preoperatively one hour before surgery that may be repeated after  30 min.  I will see her back the time of the third surgical intervention.  Camauri Craton R 04/07/2013, 6:05 PM

## 2013-04-08 ENCOUNTER — Ambulatory Visit (HOSPITAL_BASED_OUTPATIENT_CLINIC_OR_DEPARTMENT_OTHER)
Admission: RE | Admit: 2013-04-08 | Discharge: 2013-04-08 | Disposition: A | Discharge status: Home / Self Care | Payer: Federal, State, Local not specified - PPO | Source: Ambulatory Visit | Attending: Orthopedic Surgery | Admitting: Orthopedic Surgery

## 2013-04-08 ENCOUNTER — Encounter (HOSPITAL_BASED_OUTPATIENT_CLINIC_OR_DEPARTMENT_OTHER): Payer: Self-pay | Admitting: *Deleted

## 2013-04-08 ENCOUNTER — Encounter (HOSPITAL_BASED_OUTPATIENT_CLINIC_OR_DEPARTMENT_OTHER)
Admission: RE | Disposition: A | Discharge status: Home / Self Care | Payer: Self-pay | Source: Ambulatory Visit | Attending: Orthopedic Surgery

## 2013-04-08 DIAGNOSIS — I10 Essential (primary) hypertension: Secondary | ICD-10-CM | POA: Insufficient documentation

## 2013-04-08 DIAGNOSIS — E119 Type 2 diabetes mellitus without complications: Secondary | ICD-10-CM | POA: Insufficient documentation

## 2013-04-08 DIAGNOSIS — F411 Generalized anxiety disorder: Secondary | ICD-10-CM | POA: Insufficient documentation

## 2013-04-08 DIAGNOSIS — Z794 Long term (current) use of insulin: Secondary | ICD-10-CM | POA: Insufficient documentation

## 2013-04-08 DIAGNOSIS — M65312 Trigger thumb, left thumb: Secondary | ICD-10-CM

## 2013-04-08 DIAGNOSIS — M6530 Trigger finger, unspecified finger: Secondary | ICD-10-CM | POA: Insufficient documentation

## 2013-04-08 HISTORY — DX: Abrasion of right hand, initial encounter: S60.511A

## 2013-04-08 HISTORY — DX: Type 2 diabetes mellitus without complications: E11.9

## 2013-04-08 HISTORY — DX: Other specified health status: Z78.9

## 2013-04-08 HISTORY — DX: Other sleep disorders: G47.8

## 2013-04-08 HISTORY — DX: Long term (current) use of insulin: Z79.4

## 2013-04-08 HISTORY — DX: Other seasonal allergic rhinitis: J30.2

## 2013-04-08 HISTORY — DX: Trigger thumb, left thumb: M65.312

## 2013-04-08 HISTORY — DX: Family history of other specified conditions: Z84.89

## 2013-04-08 HISTORY — DX: Reserved for inherently not codable concepts without codable children: IMO0001

## 2013-04-08 HISTORY — DX: Personal history of other specified conditions: Z87.898

## 2013-04-08 HISTORY — PX: TRIGGER FINGER RELEASE: SHX641

## 2013-04-08 SURGERY — MINOR RELEASE TRIGGER FINGER/A-1 PULLEY
Anesthesia: LOCAL | Site: Thumb | Laterality: Left

## 2013-04-08 MED ORDER — BUPIVACAINE-EPINEPHRINE 0.5% -1:200000 IJ SOLN
INTRAMUSCULAR | Status: DC | PRN
  Administered 2013-04-08: 5 mL

## 2013-04-08 MED ORDER — LIDOCAINE HCL 2 % IJ SOLN
INTRAMUSCULAR | Status: AC
  Filled 2013-04-08: qty 20

## 2013-04-08 MED ORDER — BUPIVACAINE-EPINEPHRINE PF 0.5-1:200000 % IJ SOLN
INTRAMUSCULAR | Status: AC
  Filled 2013-04-08: qty 30

## 2013-04-08 MED ORDER — BUPIVACAINE HCL (PF) 0.5 % IJ SOLN
INTRAMUSCULAR | Status: AC
  Filled 2013-04-08: qty 30

## 2013-04-08 MED ORDER — CHLORHEXIDINE GLUCONATE 4 % EX LIQD: 60.0000 mL | Freq: Once | CUTANEOUS | Status: DC

## 2013-04-08 MED ORDER — LIDOCAINE HCL (PF) 1 % IJ SOLN
INTRAMUSCULAR | Status: AC
  Filled 2013-04-08: qty 30

## 2013-04-08 MED ORDER — LIDOCAINE HCL (PF) 1 % IJ SOLN
INTRAMUSCULAR | Status: DC | PRN
  Administered 2013-04-08: 5 mL

## 2013-04-08 MED ORDER — OXYCODONE-ACETAMINOPHEN 5-325 MG PO TABS: 1.0000 | ORAL_TABLET | ORAL | Status: DC | PRN

## 2013-04-08 SURGICAL SUPPLY — 47 items
BLADE SURG 15 STRL LF DISP TIS (BLADE) ×1 IMPLANT
BLADE SURG 15 STRL SS (BLADE) ×1
BNDG ELASTIC 2 VLCR STRL LF (GAUZE/BANDAGES/DRESSINGS) ×2 IMPLANT
BNDG ESMARK 4X9 LF (GAUZE/BANDAGES/DRESSINGS) IMPLANT
CHLORAPREP W/TINT 26ML (MISCELLANEOUS) IMPLANT
CORDS BIPOLAR (ELECTRODE) ×2 IMPLANT
COVER MAYO STAND STRL (DRAPES) ×2 IMPLANT
COVER TABLE BACK 60X90 (DRAPES) ×2 IMPLANT
CUFF TOURNIQUET SINGLE 18IN (TOURNIQUET CUFF) IMPLANT
DECANTER SPIKE VIAL GLASS SM (MISCELLANEOUS) IMPLANT
DRAPE EXTREMITY T 121X128X90 (DRAPE) ×2 IMPLANT
DRAPE SURG 17X23 STRL (DRAPES) ×2 IMPLANT
DURAPREP 26ML APPLICATOR (WOUND CARE) ×2 IMPLANT
GAUZE XEROFORM 1X8 LF (GAUZE/BANDAGES/DRESSINGS) ×2 IMPLANT
GLOVE BIO SURGEON STRL SZ 6 (GLOVE) ×2 IMPLANT
GLOVE BIO SURGEON STRL SZ7.5 (GLOVE) ×2 IMPLANT
GLOVE BIO SURGEON STRL SZ8.5 (GLOVE) ×2 IMPLANT
GLOVE BIOGEL PI IND STRL 6.5 (GLOVE) ×1 IMPLANT
GLOVE BIOGEL PI IND STRL 7.0 (GLOVE) ×1 IMPLANT
GLOVE BIOGEL PI IND STRL 8 (GLOVE) ×1 IMPLANT
GLOVE BIOGEL PI IND STRL 9 (GLOVE) ×1 IMPLANT
GLOVE BIOGEL PI INDICATOR 6.5 (GLOVE) ×1
GLOVE BIOGEL PI INDICATOR 7.0 (GLOVE) ×1
GLOVE BIOGEL PI INDICATOR 8 (GLOVE) ×1
GLOVE BIOGEL PI INDICATOR 9 (GLOVE) ×1
GLOVE ECLIPSE 6.5 STRL STRAW (GLOVE) ×2 IMPLANT
GOWN PREVENTION PLUS XLARGE (GOWN DISPOSABLE) ×4 IMPLANT
GOWN PREVENTION PLUS XXLARGE (GOWN DISPOSABLE) ×2 IMPLANT
NEEDLE HYPO 22GX1.5 SAFETY (NEEDLE) IMPLANT
PACK BASIN DAY SURGERY FS (CUSTOM PROCEDURE TRAY) ×2 IMPLANT
PAD CAST 3X4 CTTN HI CHSV (CAST SUPPLIES) ×1 IMPLANT
PADDING CAST ABS 4INX4YD NS (CAST SUPPLIES) ×1
PADDING CAST ABS COTTON 4X4 ST (CAST SUPPLIES) ×1 IMPLANT
PADDING CAST COTTON 3X4 STRL (CAST SUPPLIES) ×1
SLEEVE SCD COMPRESS KNEE MED (MISCELLANEOUS) IMPLANT
SPLINT PLASTER CAST XFAST 3X15 (CAST SUPPLIES) ×1 IMPLANT
SPLINT PLASTER XTRA FASTSET 3X (CAST SUPPLIES) ×1
SPLINT UNIVERSAL RIGHT (SOFTGOODS) IMPLANT
SPLINT WRIST 10 LEFT UNV (SOFTGOODS) IMPLANT
SUT ETHILON 5 0 P 3 18 (SUTURE)
SUT NYLON ETHILON 5-0 P-3 1X18 (SUTURE) IMPLANT
SUT VIC AB 2-0 SH 27 (SUTURE)
SUT VIC AB 2-0 SH 27XBRD (SUTURE) IMPLANT
SYR BULB 3OZ (MISCELLANEOUS) ×2 IMPLANT
SYR CONTROL 10ML LL (SYRINGE) IMPLANT
TOWEL OR 17X24 6PK STRL BLUE (TOWEL DISPOSABLE) ×4 IMPLANT
UNDERPAD 30X30 INCONTINENT (UNDERPADS AND DIAPERS) ×2 IMPLANT

## 2013-04-08 NOTE — Op Note (Signed)
PATIENT ID:      Brandi Blevins  MRN:     [id number] DOB/AGE:    07/17/1975 / 37 y.o.       OPERATIVE REPORT    DATE OF PROCEDURE:  04/08/2013       PREOPERATIVE DIAGNOSIS:   LEFT TRIGGER THUMB      Estimated body mass index is 29.26 kg/(m^2) as calculated from the following:   Height as of this encounter: 5\' 2"  (1.575 m).   Weight as of this encounter: 72.576 kg (160 lb).                                                        POSTOPERATIVE DIAGNOSIS:   LEFT TRIGGER THUMB                                                                      PROCEDURE:  Procedure(s): MINOR RELEASE TRIGGER FINGER/A-1 PULLEY     SURGEON: Brette Cast J    ASSISTANT:   Allena Katz  (Present and scrubbed throughout the case, critical for assistance with exposure, retraction, and closure.)         ANESTHESIA: Local  TOURNIQUET TIME:   COMPLICATIONS:  None        SPECIMENS: None   INDICATIONS FOR PROCEDURE: The patient has left thumb finger trigger . Three-month history of left thumb triggering now locked in full extension. Patient had a right trigger thumb release by my partner Dr. Ave Filter 3 years ago and now wants same on the left side.. Patient is failed conservative measures with observation anti-inflammatory medicine. Risks and benefits of surgery have been discussed, questions answered.    Patient was brought to operating room 2, timeout procedure was performed, left hand was then cleansed with alcohol and a local anesthetic consisting of 3 cc of 2% Xylocaine with epinephrine and 3 cc of half percent Marcaine without epinephrine was injected near the A1 pulley of the left thumb obtaining good local anesthesia. A tourniquet was placed on the left wrist. The upper extremity was prepped and draped in the usual sterile fashion from the tourniquet to the fingertips. The hand was wrapped with an Esmarch bandage and the tourniquet inflated to Hg. a 1 cm transverse incision just proximal to the  A1 pulley and the MP flexor crease was made through the skin and subcutaneous tissue. Using tenotomy scissors we then spread the tissues down to the level of the A1 pulley and tendon sheath and placed a small L-shaped retractors on either side to protect the digital nerves. We then incised the tendon sheath over the A1 pulley performing the trigger finger release. This was extended proximally and distally for 1 cm. The patient was then instructed to flex and extend the thumb which accomplished without difficulty and there was no triggering noted. The tourniquet was let down small bleeders were cauterized with the bipolar the wound is irrigated out normal saline solution and closed with running 5-0 nylon suture. A dressing of Xerofoam 4 x 4 dressing sponges and attention Ace wrap was then applied  the patient was taken to the recovery without difficulty.

## 2013-04-08 NOTE — Interval H&P Note (Signed)
History and Physical Interval Note:  04/08/2013 11:06 AM  Brandi Blevins  has presented today for surgery, with the diagnosis of LEFT TRIGGER THUMB  The various methods of treatment have been discussed with the patient and family. After consideration of risks, benefits and other options for treatment, the patient has consented to  Procedure(s): MINOR RELEASE TRIGGER FINGER/A-1 PULLEY (Left) as a surgical intervention .  The patient's history has been reviewed, patient examined, no change in status, stable for surgery.  I have reviewed the patient's chart and labs.  Questions were answered to the patient's satisfaction.     [name]

## 2013-04-09 ENCOUNTER — Encounter (HOSPITAL_BASED_OUTPATIENT_CLINIC_OR_DEPARTMENT_OTHER): Payer: Self-pay | Admitting: Orthopedic Surgery

## 2013-06-06 ENCOUNTER — Telehealth: Payer: Self-pay | Admitting: Internal Medicine

## 2013-06-06 NOTE — Telephone Encounter (Signed)
Mailed medical records to pt on 05/10/13

## 2013-06-10 ENCOUNTER — Telehealth: Payer: Self-pay | Admitting: Family Medicine
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
## 2013-07-26 ENCOUNTER — Ambulatory Visit (INDEPENDENT_AMBULATORY_CARE_PROVIDER_SITE_OTHER): Payer: Federal, State, Local not specified - PPO | Admitting: Ophthalmology

## 2013-08-02 ENCOUNTER — Telehealth: Payer: Self-pay | Admitting: Internal Medicine

## 2013-08-02 NOTE — Telephone Encounter (Signed)
Fax[facility] on April 21 [phone]

## 2013-08-23 ENCOUNTER — Ambulatory Visit (INDEPENDENT_AMBULATORY_CARE_PROVIDER_SITE_OTHER): Payer: Federal, State, Local not specified - PPO | Admitting: Ophthalmology

## 2013-08-23 DIAGNOSIS — E1065 Type 1 diabetes mellitus with hyperglycemia: Secondary | ICD-10-CM

## 2013-08-23 DIAGNOSIS — E11319 Type 2 diabetes mellitus with unspecified diabetic retinopathy without macular edema: Secondary | ICD-10-CM

## 2013-08-23 DIAGNOSIS — E1039 Type 1 diabetes mellitus with other diabetic ophthalmic complication: Secondary | ICD-10-CM

## 2013-08-23 DIAGNOSIS — H35039 Hypertensive retinopathy, unspecified eye: Secondary | ICD-10-CM

## 2013-08-23 DIAGNOSIS — I10 Essential (primary) hypertension: Secondary | ICD-10-CM

## 2013-08-23 DIAGNOSIS — H2510 Age-related nuclear cataract, unspecified eye: Secondary | ICD-10-CM

## 2013-08-23 DIAGNOSIS — H43819 Vitreous degeneration, unspecified eye: Secondary | ICD-10-CM

## 2013-10-04 ENCOUNTER — Ambulatory Visit (INDEPENDENT_AMBULATORY_CARE_PROVIDER_SITE_OTHER): Payer: Federal, State, Local not specified - PPO | Admitting: Podiatrist

## 2013-10-04 ENCOUNTER — Encounter: Payer: Self-pay | Admitting: Podiatrist

## 2013-10-04 VITALS — BP 127/75 | HR 54 | Resp 14 | Ht 62.5 in | Wt 165.0 lb

## 2013-10-04 DIAGNOSIS — E119 Type 2 diabetes mellitus without complications: Secondary | ICD-10-CM

## 2013-10-04 NOTE — Patient Instructions (Signed)
Diabetes and Foot Care Diabetes may cause you to have problems because of poor blood supply (circulation) to your feet and legs. This may cause the skin on your feet to become thinner, break easier, and heal more slowly. Your skin may become dry, and the skin may peel and crack. You may also have nerve damage in your legs and feet causing decreased feeling in them. You may not notice minor injuries to your feet that could lead to infections or more serious problems. Taking care of your feet is one of the most important things you can do for yourself.  HOME CARE INSTRUCTIONS  Wear shoes at all times, even in the house. Do not go barefoot. Bare feet are easily injured.  Check your feet daily for blisters, cuts, and redness. If you cannot see the bottom of your feet, use a mirror or ask someone for help.  Wash your feet with warm water (do not use hot water) and mild soap. Then pat your feet and the areas between your toes until they are completely dry. Do not soak your feet as this can dry your skin.  Apply a moisturizing lotion or petroleum jelly (that does not contain alcohol and is unscented) to the skin on your feet and to dry, brittle toenails. Do not apply lotion between your toes.  Trim your toenails straight across. Do not dig under them or around the cuticle. File the edges of your nails with an emery board or nail file.  Do not cut corns or calluses or try to remove them with medicine.  Wear clean socks or stockings every day. Make sure they are not too tight. Do not wear knee-high stockings since they may decrease blood flow to your legs.  Wear shoes that fit properly and have enough cushioning. To break in new shoes, wear them for just a few hours a day. This prevents you from injuring your feet. Always look in your shoes before you put them on to be sure there are no objects inside.  Do not cross your legs. This may decrease the blood flow to your feet.  If you find a minor scrape,  cut, or break in the skin on your feet, keep it and the skin around it clean and dry. These areas may be cleansed with mild soap and water. Do not cleanse the area with peroxide, alcohol, or iodine.  When you remove an adhesive bandage, be sure not to damage the skin around it.  If you have a wound, look at it several times a day to make sure it is healing.  Do not use heating pads or hot water bottles. They may burn your skin. If you have lost feeling in your feet or legs, you may not know it is happening until it is too late.  Make sure your health care provider performs a complete foot exam at least annually or more often if you have foot problems. Report any cuts, sores, or bruises to your health care provider immediately. SEEK MEDICAL CARE IF:   You have an injury that is not healing.  You have cuts or breaks in the skin.  You have an ingrown nail.  You notice redness on your legs or feet.  You feel burning or tingling in your legs or feet.  You have pain or cramps in your legs and feet.  Your legs or feet are numb.  Your feet always feel cold. SEEK IMMEDIATE MEDICAL CARE IF:   There is increasing redness,   swelling, or pain in or around a wound.  There is a red line that goes up your leg.  Pus is coming from a wound.  You develop a fever or as directed by your health care provider.  You notice a bad smell coming from an ulcer or wound. Document Released: 03/25/2000 Document Revised: 11/28/2012 Document Reviewed: 09/04/2012 Exit[employer] Patient Information 2015 [employer]. This information is not intended to replace advice given to you by your health care provider. Make sure you discuss any questions you have with your health care provider.  

## 2013-10-04 NOTE — Progress Notes (Deleted)
   Subjective:    Patient ID: Brandi KeySharon Blevins, female    DOB: Aug 24, 1975, 38 y.o.   MRN: [id number]  HPI Comments: Pt presents for diabetic foot exam and debridement of 10 toenails.  Diabetes      Review of Systems  All other systems reviewed and are negative.      Objective:   Physical Exam        Assessment & Plan:

## 2013-10-07 NOTE — Progress Notes (Signed)
  Chief Complaint  Patient presents with  . Diabetes    "I have juvenill diabetes and recently had an ingrown toenail taken care of and I was told to get a yearly check-up."  10 toenails and left 1st toenail was ingrown     HPI: Patient is 38 y.o. female who presents today for and initial diabetic foot evaluation. She has no active foot problems at this time.  She relates the ingrown toenail has resolved but she wanted to make sure she is not at risk for foot problems in the future.  She has an insulin pump and relates she takes her blood sugar daily. She relates she has very stable blood sugars.     Review of Systems  DATA OBTAINED: from patient  -- all systems reviewed to be negative GENERAL: Feels well no fevers, no fatigue, no changes in appetite SKIN: No itching, no rashes, no open lesions, no wounds EYES: No eye pain,no redness, no discharge EARS: No earache,no ringing of ears, no recent change in hearing NOSE: No congestion, no drainage, no bleeding  MOUTH/THROAT: No mouth pain, No sore throat, No difficulty chewing or swallowing  RESPIRATORY: No cough, no wheezing, no SOB CARDIAC: No chest pain,no heart palpitations,no new onset lower extremity edema  GI: No abdominal pain, No Nausea, no vomiting, no diarrhea, no heartburn or no reflux  GU: No dysuria, no increased frequency or urgency MUSCULOSKELETAL: No unrelieved bone/joint pain,  NEUROLOGIC: Awake, alert, appropriate to situation, No change in mental status. PSYCHIATRIC: No overt anxiety or sadness.No behavior issue.  AMBULATION:  Ambulates unassisted    Physical Exam  GENERAL APPEARANCE: Alert, conversant. Appropriately groomed. No acute distress.  VASCULAR: Pedal pulses palpable and strong bilateral.  Capillary refill time is immediate to all digits,  Proximal to distal cooling it warm to warm.  Digital hair growth is present bilateral  NEUROLOGIC: sensation is intact epicritically and protectively to 5.07 monofilament  at 5/5 sites bilateral.  Light touch is intact bilateral, vibratory sensation intact bilateral, achilles tendon reflex is intact bilateral.  MUSCULOSKELETAL: acceptable muscle strength, tone and stability bilateral.  Intrinsic muscluature intact bilateral.  Rectus appearance of foot and digits noted bilateral.   DERMATOLOGIC: skin color, texture, and turger are within normal limits.  No preulcerative lesions are seen, no interdigital maceration noted.  No open lesions present.  Digital nails are asymptomatic. No ingrown nail deformity is present.  No abnormalities within the nail plates seen.    Patient Active Problem List   Diagnosis Date Noted  . Diabetes mellitus 04/07/2011    Assessment   Insulin dependent Diabetic with no complication  Plan  Discussed good foot care and dispensed written material and diabetes and foot health.  If she has any problems or concerns in the future or if she notices any skin changes, or changes in the feeling she is experiencing in her feet she is instructed to call for a recheck.

## 2014-02-10 ENCOUNTER — Encounter: Payer: Self-pay | Admitting: Podiatrist

## 2014-02-28 ENCOUNTER — Ambulatory Visit (INDEPENDENT_AMBULATORY_CARE_PROVIDER_SITE_OTHER): Payer: Federal, State, Local not specified - PPO | Admitting: Ophthalmology

## 2014-03-04 ENCOUNTER — Ambulatory Visit (INDEPENDENT_AMBULATORY_CARE_PROVIDER_SITE_OTHER): Payer: Federal, State, Local not specified - PPO | Admitting: Ophthalmology

## 2014-03-04 DIAGNOSIS — H35033 Hypertensive retinopathy, bilateral: Secondary | ICD-10-CM

## 2014-03-04 DIAGNOSIS — E10339 Type 1 diabetes mellitus with moderate nonproliferative diabetic retinopathy without macular edema: Secondary | ICD-10-CM

## 2014-03-04 DIAGNOSIS — I10 Essential (primary) hypertension: Secondary | ICD-10-CM

## 2014-03-04 DIAGNOSIS — E10319 Type 1 diabetes mellitus with unspecified diabetic retinopathy without macular edema: Secondary | ICD-10-CM

## 2014-03-04 DIAGNOSIS — E10329 Type 1 diabetes mellitus with mild nonproliferative diabetic retinopathy without macular edema: Secondary | ICD-10-CM

## 2014-09-04 ENCOUNTER — Ambulatory Visit (INDEPENDENT_AMBULATORY_CARE_PROVIDER_SITE_OTHER): Payer: Federal, State, Local not specified - PPO | Admitting: Ophthalmology

## 2014-09-04 DIAGNOSIS — H35033 Hypertensive retinopathy, bilateral: Secondary | ICD-10-CM

## 2014-09-04 DIAGNOSIS — E10319 Type 1 diabetes mellitus with unspecified diabetic retinopathy without macular edema: Secondary | ICD-10-CM

## 2014-09-04 DIAGNOSIS — H43813 Vitreous degeneration, bilateral: Secondary | ICD-10-CM | POA: Diagnosis not present

## 2014-09-04 DIAGNOSIS — I10 Essential (primary) hypertension: Secondary | ICD-10-CM

## 2014-09-04 DIAGNOSIS — E10329 Type 1 diabetes mellitus with mild nonproliferative diabetic retinopathy without macular edema: Secondary | ICD-10-CM

## 2015-03-12 ENCOUNTER — Ambulatory Visit (INDEPENDENT_AMBULATORY_CARE_PROVIDER_SITE_OTHER): Payer: Federal, State, Local not specified - PPO | Admitting: Ophthalmology

## 2015-03-19 ENCOUNTER — Ambulatory Visit (INDEPENDENT_AMBULATORY_CARE_PROVIDER_SITE_OTHER): Payer: Federal, State, Local not specified - PPO | Admitting: Ophthalmology

## 2015-03-19 DIAGNOSIS — E10319 Type 1 diabetes mellitus with unspecified diabetic retinopathy without macular edema: Secondary | ICD-10-CM

## 2015-03-19 DIAGNOSIS — H43813 Vitreous degeneration, bilateral: Secondary | ICD-10-CM | POA: Diagnosis not present

## 2015-03-19 DIAGNOSIS — E103292 Type 1 diabetes mellitus with mild nonproliferative diabetic retinopathy without macular edema, left eye: Secondary | ICD-10-CM | POA: Diagnosis not present

## 2015-03-19 DIAGNOSIS — H35033 Hypertensive retinopathy, bilateral: Secondary | ICD-10-CM

## 2015-03-19 DIAGNOSIS — H2511 Age-related nuclear cataract, right eye: Secondary | ICD-10-CM

## 2015-03-19 DIAGNOSIS — E103391 Type 1 diabetes mellitus with moderate nonproliferative diabetic retinopathy without macular edema, right eye: Secondary | ICD-10-CM

## 2015-03-19 DIAGNOSIS — I10 Essential (primary) hypertension: Secondary | ICD-10-CM

## 2015-09-17 ENCOUNTER — Ambulatory Visit (INDEPENDENT_AMBULATORY_CARE_PROVIDER_SITE_OTHER): Payer: Federal, State, Local not specified - PPO | Admitting: Ophthalmology

## 2015-10-01 ENCOUNTER — Ambulatory Visit (INDEPENDENT_AMBULATORY_CARE_PROVIDER_SITE_OTHER): Payer: Federal, State, Local not specified - PPO | Admitting: Ophthalmology

## 2015-10-01 DIAGNOSIS — E103391 Type 1 diabetes mellitus with moderate nonproliferative diabetic retinopathy without macular edema, right eye: Secondary | ICD-10-CM | POA: Diagnosis not present

## 2015-10-01 DIAGNOSIS — H2511 Age-related nuclear cataract, right eye: Secondary | ICD-10-CM | POA: Diagnosis not present

## 2015-10-01 DIAGNOSIS — E10319 Type 1 diabetes mellitus with unspecified diabetic retinopathy without macular edema: Secondary | ICD-10-CM

## 2015-10-01 DIAGNOSIS — H43813 Vitreous degeneration, bilateral: Secondary | ICD-10-CM | POA: Diagnosis not present

## 2015-10-01 DIAGNOSIS — H35033 Hypertensive retinopathy, bilateral: Secondary | ICD-10-CM

## 2015-10-01 DIAGNOSIS — I10 Essential (primary) hypertension: Secondary | ICD-10-CM | POA: Diagnosis not present

## 2015-10-01 DIAGNOSIS — E103292 Type 1 diabetes mellitus with mild nonproliferative diabetic retinopathy without macular edema, left eye: Secondary | ICD-10-CM

## 2016-03-25 ENCOUNTER — Ambulatory Visit (INDEPENDENT_AMBULATORY_CARE_PROVIDER_SITE_OTHER): Payer: Federal, State, Local not specified - PPO | Admitting: Ophthalmology

## 2016-03-25 DIAGNOSIS — E103311 Type 1 diabetes mellitus with moderate nonproliferative diabetic retinopathy with macular edema, right eye: Secondary | ICD-10-CM | POA: Diagnosis not present

## 2016-03-25 DIAGNOSIS — I10 Essential (primary) hypertension: Secondary | ICD-10-CM | POA: Diagnosis not present

## 2016-03-25 DIAGNOSIS — H43813 Vitreous degeneration, bilateral: Secondary | ICD-10-CM

## 2016-03-25 DIAGNOSIS — H35033 Hypertensive retinopathy, bilateral: Secondary | ICD-10-CM | POA: Diagnosis not present

## 2016-03-25 DIAGNOSIS — E10311 Type 1 diabetes mellitus with unspecified diabetic retinopathy with macular edema: Secondary | ICD-10-CM

## 2016-03-25 DIAGNOSIS — E103292 Type 1 diabetes mellitus with mild nonproliferative diabetic retinopathy without macular edema, left eye: Secondary | ICD-10-CM

## 2016-06-08 ENCOUNTER — Telehealth: Payer: Self-pay | Admitting: Internal Medicine

## 2016-06-08 NOTE — Telephone Encounter (Signed)
[facility] for appointment on 06/10/16 with Dr Rennis GoldenHilty.  Records put with Dr Blanchie DessertHilty's schedule for 06/10/16. lp

## 2016-06-10 ENCOUNTER — Ambulatory Visit (INDEPENDENT_AMBULATORY_CARE_PROVIDER_SITE_OTHER): Payer: Federal, State, Local not specified - PPO | Admitting: Internal Medicine

## 2016-06-10 ENCOUNTER — Encounter: Payer: Self-pay | Admitting: Internal Medicine

## 2016-06-10 VITALS — BP 100/70 | HR 56 | Ht 63.0 in | Wt 169.8 lb

## 2016-06-10 DIAGNOSIS — Z139 Encounter for screening, unspecified: Secondary | ICD-10-CM | POA: Insufficient documentation

## 2016-06-10 DIAGNOSIS — E109 Type 1 diabetes mellitus without complications: Secondary | ICD-10-CM | POA: Diagnosis not present

## 2016-06-10 NOTE — Patient Instructions (Signed)
Your physician recommends that you schedule a follow-up appointment as needed  

## 2016-06-10 NOTE — Progress Notes (Signed)
OFFICE NOTE  Chief Complaint:  Cardiac risk factor assessment  Primary Care Physician: Eartha InchBADGER,MICHAEL C, MD  HPI:  Brandi Blevins is a 41 y.o. female who is type I diabetic diagnosed at age 89 or 89. She's had fairly good blood sugar control although obviously is at increased cardiovascular risk. She has a 40-year-old child and is contemplating another pregnancy. She was referred by her OB/GYN for risk factor assessment for pregnancy. She is somewhat physically active, playing tennis and doing some aerobic exercise couple times a week with a trainer in a gym. 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 She denies any chest pain or worsening shortness of breath with activities. She has no coronary history, but does have hypertension on nifedipine. She also had preeclampsia during her first pregnancy. Family history significant for hypertension in her grandmother and a stroke and heart attack in her grandfather but in his 5880s. Her father also had juvenile diabetes but is 4674 and his head excellent blood sugar control and no known cardiovascular events.  PMHx:  Past Medical History:  Diagnosis Date  . Abrasion of right hand 04/02/2013  . Anxiety   . Complication of anesthesia   . Family history of anesthesia complication    pt's mother has hx. of post-op N/V  . History of seizures    due to low blood sugar  . Hypertension    under control with med., has been on med. x 1 1/2 yr.  . IDDM (insulin dependent diabetes mellitus) (HCC)    on insulin pump  . PONV (postoperative nausea and vomiting)   . Poor intravenous access   . Poor sleep pattern    sleep study done 09/2009 - AHI 0.2/hr.  . Seasonal allergies   . Trigger thumb of left hand 03/2013    Past Surgical History:  Procedure Laterality Date  . ANKLE SURGERY Left    x 2  . CESAREAN SECTION  04/20/2012   Procedure: CESAREAN SECTION;  Surgeon: Freddrick MarchKendra H. Tenny Crawoss, MD;  Location: [facility];  Service: Obstetrics;   Laterality: N/A;  . NASAL SEPTUM SURGERY    . TONSILLECTOMY AND ADENOIDECTOMY    . TRIGGER FINGER RELEASE Left 04/08/2013   Procedure: MINOR RELEASE TRIGGER FINGER/A-1 PULLEY;  Surgeon: Nestor LewandowskyFrank J Rowan, MD;  Location: [facility];  Service: Orthopedics;  Laterality: Left;  . WISDOM TOOTH EXTRACTION      FAMHx:  Family History  Problem Relation Age of Onset  . Anesthesia problems Mother     post-op N/V  . Arthritis Mother   . Diabetes Father     SOCHx:   reports that she quit smoking about 11 years ago. She has never used smokeless tobacco. She reports that she drinks alcohol. She reports that she does not use drugs.  ALLERGIES:  Allergies  Allergen Reactions  . Wasp Venom Protein Anaphylaxis  . Advil [Ibuprofen] Hives    ROS: Pertinent items noted in HPI and remainder of comprehensive ROS otherwise negative.  HOME MEDS: Current Outpatient Prescriptions on File Prior to Visit  Medication Sig Dispense Refill  . desloratadine (CLARINEX) 5 MG tablet Take 5 mg by mouth daily.    . Drospirenone-Ethinyl Estradiol (YASMIN 28 PO) Take by mouth.    . Insulin Human (INSULIN PUMP) 100 unit/ml SOLN Inject 1 each into the skin as needed. novolog insulin    . levocetirizine (XYZAL) 5 MG tablet Take 5 mg by mouth every evening.  No current facility-administered medications on file prior to visit.     LABS/IMAGING: No results found for this or any previous visit (from the past 48 hour(s)). No results found.  WEIGHTS: Wt Readings from Last 3 Encounters:  06/10/16 169 lb 12.8 oz (77 kg)  10/04/13 165 lb (74.8 kg)  04/02/13 160 lb (72.6 kg)    VITALS: BP 100/70   Pulse (!) 56   Ht 5\' 3"  (1.6 m)   Wt 169 lb 12.8 oz (77 kg)   BMI 30.08 kg/m   EXAM: General appearance: alert and no distress Neck: no carotid bruit and no JVD Lungs: clear to auscultation bilaterally Heart: regular rate and rhythm, S1, S2 normal, no murmur, click, rub or gallop Abdomen: soft,  non-tender; bowel sounds normal; no masses,  no organomegaly Extremities: extremities normal, atraumatic, no cyanosis or edema Pulses: 2+ and symmetric Skin: Skin color, texture, turgor normal. No rashes or lesions Neurologic: Grossly normal Psych: Pleasant  EKG: Sinus bradycardia at 56  ASSESSMENT: 1. Not considered at increased risk for pregnancy  PLAN: 1.   Ms. Sass should not be considered at increased risk for pregnancy despite being a type I diabetic. She has hypertension but it's well-controlled and has had no prior coronary events. She has no anginal symptoms and is able to do most activities without any limitations. She did have preeclampsia would be at increased risk for this again. I would not recommend any further coronary testing prior to conceiving. Ultimately at some point after her pregnancy would be reasonable to recheck a lipid profile and if cholesterol remains elevated with LDL-C greater than 70 then consider lifestyle modifications and/or statin therapy to reach goal cholesterol. Statins would be contra indicated during pregnancy therefore I do not see a reason to assess that at this time.  Thanks for the kind referral. She can follow-up with me as needed.  Chrystie Nose, MD, [facility] Attending Cardiologist [facility]  Chrystie Nose 06/10/2016, 5:58 PM

## 2016-09-23 ENCOUNTER — Ambulatory Visit (INDEPENDENT_AMBULATORY_CARE_PROVIDER_SITE_OTHER): Payer: Federal, State, Local not specified - PPO | Admitting: Ophthalmology

## 2016-09-23 DIAGNOSIS — I10 Essential (primary) hypertension: Secondary | ICD-10-CM | POA: Diagnosis not present

## 2016-09-23 DIAGNOSIS — E103292 Type 1 diabetes mellitus with mild nonproliferative diabetic retinopathy without macular edema, left eye: Secondary | ICD-10-CM | POA: Diagnosis not present

## 2016-09-23 DIAGNOSIS — H43813 Vitreous degeneration, bilateral: Secondary | ICD-10-CM | POA: Diagnosis not present

## 2016-09-23 DIAGNOSIS — H35033 Hypertensive retinopathy, bilateral: Secondary | ICD-10-CM | POA: Diagnosis not present

## 2016-09-23 DIAGNOSIS — E10319 Type 1 diabetes mellitus with unspecified diabetic retinopathy without macular edema: Secondary | ICD-10-CM | POA: Diagnosis not present

## 2016-09-23 DIAGNOSIS — E103391 Type 1 diabetes mellitus with moderate nonproliferative diabetic retinopathy without macular edema, right eye: Secondary | ICD-10-CM

## 2017-03-24 ENCOUNTER — Ambulatory Visit (INDEPENDENT_AMBULATORY_CARE_PROVIDER_SITE_OTHER): Payer: Federal, State, Local not specified - PPO | Admitting: Ophthalmology

## 2017-03-24 DIAGNOSIS — H35033 Hypertensive retinopathy, bilateral: Secondary | ICD-10-CM | POA: Diagnosis not present

## 2017-03-24 DIAGNOSIS — E103391 Type 1 diabetes mellitus with moderate nonproliferative diabetic retinopathy without macular edema, right eye: Secondary | ICD-10-CM

## 2017-03-24 DIAGNOSIS — E10319 Type 1 diabetes mellitus with unspecified diabetic retinopathy without macular edema: Secondary | ICD-10-CM | POA: Diagnosis not present

## 2017-03-24 DIAGNOSIS — I10 Essential (primary) hypertension: Secondary | ICD-10-CM | POA: Diagnosis not present

## 2017-03-24 DIAGNOSIS — E103292 Type 1 diabetes mellitus with mild nonproliferative diabetic retinopathy without macular edema, left eye: Secondary | ICD-10-CM | POA: Diagnosis not present

## 2017-03-24 DIAGNOSIS — H43813 Vitreous degeneration, bilateral: Secondary | ICD-10-CM | POA: Diagnosis not present

## 2017-03-24 DIAGNOSIS — H2511 Age-related nuclear cataract, right eye: Secondary | ICD-10-CM

## 2017-09-28 ENCOUNTER — Encounter (INDEPENDENT_AMBULATORY_CARE_PROVIDER_SITE_OTHER): Payer: Federal, State, Local not specified - PPO | Admitting: Ophthalmology

## 2017-10-05 ENCOUNTER — Encounter (INDEPENDENT_AMBULATORY_CARE_PROVIDER_SITE_OTHER): Payer: Federal, State, Local not specified - PPO | Admitting: Ophthalmology

## 2017-10-05 DIAGNOSIS — E103393 Type 1 diabetes mellitus with moderate nonproliferative diabetic retinopathy without macular edema, bilateral: Secondary | ICD-10-CM | POA: Diagnosis not present

## 2017-10-05 DIAGNOSIS — H43813 Vitreous degeneration, bilateral: Secondary | ICD-10-CM

## 2017-10-05 DIAGNOSIS — I10 Essential (primary) hypertension: Secondary | ICD-10-CM | POA: Diagnosis not present

## 2017-10-05 DIAGNOSIS — H35033 Hypertensive retinopathy, bilateral: Secondary | ICD-10-CM | POA: Diagnosis not present

## 2017-10-05 DIAGNOSIS — H2513 Age-related nuclear cataract, bilateral: Secondary | ICD-10-CM

## 2017-10-05 DIAGNOSIS — E10319 Type 1 diabetes mellitus with unspecified diabetic retinopathy without macular edema: Secondary | ICD-10-CM

## 2018-02-28 ENCOUNTER — Encounter (INDEPENDENT_AMBULATORY_CARE_PROVIDER_SITE_OTHER): Payer: Federal, State, Local not specified - PPO | Admitting: Ophthalmology

## 2018-03-12 ENCOUNTER — Encounter (INDEPENDENT_AMBULATORY_CARE_PROVIDER_SITE_OTHER): Payer: Federal, State, Local not specified - PPO | Admitting: Ophthalmology

## 2018-03-19 ENCOUNTER — Encounter (INDEPENDENT_AMBULATORY_CARE_PROVIDER_SITE_OTHER): Payer: Federal, State, Local not specified - PPO | Admitting: Ophthalmology

## 2018-03-19 DIAGNOSIS — I10 Essential (primary) hypertension: Secondary | ICD-10-CM

## 2018-03-19 DIAGNOSIS — E10319 Type 1 diabetes mellitus with unspecified diabetic retinopathy without macular edema: Secondary | ICD-10-CM

## 2018-03-19 DIAGNOSIS — H35033 Hypertensive retinopathy, bilateral: Secondary | ICD-10-CM | POA: Diagnosis not present

## 2018-03-19 DIAGNOSIS — H2513 Age-related nuclear cataract, bilateral: Secondary | ICD-10-CM

## 2018-03-19 DIAGNOSIS — E103293 Type 1 diabetes mellitus with mild nonproliferative diabetic retinopathy without macular edema, bilateral: Secondary | ICD-10-CM

## 2018-03-19 DIAGNOSIS — H43813 Vitreous degeneration, bilateral: Secondary | ICD-10-CM

## 2018-09-10 ENCOUNTER — Encounter (INDEPENDENT_AMBULATORY_CARE_PROVIDER_SITE_OTHER): Payer: Federal, State, Local not specified - PPO | Admitting: Ophthalmology

## 2018-09-10 ENCOUNTER — Other Ambulatory Visit: Payer: Self-pay

## 2018-09-10 DIAGNOSIS — H35033 Hypertensive retinopathy, bilateral: Secondary | ICD-10-CM

## 2018-09-10 DIAGNOSIS — H43813 Vitreous degeneration, bilateral: Secondary | ICD-10-CM

## 2018-09-10 DIAGNOSIS — E10319 Type 1 diabetes mellitus with unspecified diabetic retinopathy without macular edema: Secondary | ICD-10-CM

## 2018-09-10 DIAGNOSIS — I10 Essential (primary) hypertension: Secondary | ICD-10-CM

## 2018-09-10 DIAGNOSIS — E103292 Type 1 diabetes mellitus with mild nonproliferative diabetic retinopathy without macular edema, left eye: Secondary | ICD-10-CM | POA: Diagnosis not present

## 2018-09-10 DIAGNOSIS — E103391 Type 1 diabetes mellitus with moderate nonproliferative diabetic retinopathy without macular edema, right eye: Secondary | ICD-10-CM

## 2018-09-10 DIAGNOSIS — H2513 Age-related nuclear cataract, bilateral: Secondary | ICD-10-CM

## 2019-03-11 ENCOUNTER — Encounter (INDEPENDENT_AMBULATORY_CARE_PROVIDER_SITE_OTHER): Payer: Federal, State, Local not specified - PPO | Admitting: Ophthalmology

## 2019-03-11 DIAGNOSIS — I10 Essential (primary) hypertension: Secondary | ICD-10-CM | POA: Diagnosis not present

## 2019-03-11 DIAGNOSIS — H35033 Hypertensive retinopathy, bilateral: Secondary | ICD-10-CM | POA: Diagnosis not present

## 2019-03-11 DIAGNOSIS — E103393 Type 1 diabetes mellitus with moderate nonproliferative diabetic retinopathy without macular edema, bilateral: Secondary | ICD-10-CM | POA: Diagnosis not present

## 2019-03-11 DIAGNOSIS — H43813 Vitreous degeneration, bilateral: Secondary | ICD-10-CM

## 2019-03-11 DIAGNOSIS — E10319 Type 1 diabetes mellitus with unspecified diabetic retinopathy without macular edema: Secondary | ICD-10-CM | POA: Diagnosis not present

## 2019-09-12 ENCOUNTER — Other Ambulatory Visit: Payer: Self-pay

## 2019-09-12 ENCOUNTER — Encounter (INDEPENDENT_AMBULATORY_CARE_PROVIDER_SITE_OTHER): Payer: Federal, State, Local not specified - PPO | Admitting: Ophthalmology

## 2019-09-12 DIAGNOSIS — E103393 Type 1 diabetes mellitus with moderate nonproliferative diabetic retinopathy without macular edema, bilateral: Secondary | ICD-10-CM | POA: Diagnosis not present

## 2019-09-12 DIAGNOSIS — H35033 Hypertensive retinopathy, bilateral: Secondary | ICD-10-CM

## 2019-09-12 DIAGNOSIS — I10 Essential (primary) hypertension: Secondary | ICD-10-CM

## 2019-09-12 DIAGNOSIS — E10319 Type 1 diabetes mellitus with unspecified diabetic retinopathy without macular edema: Secondary | ICD-10-CM

## 2019-09-12 DIAGNOSIS — H43813 Vitreous degeneration, bilateral: Secondary | ICD-10-CM

## 2020-04-15 ENCOUNTER — Encounter (INDEPENDENT_AMBULATORY_CARE_PROVIDER_SITE_OTHER): Payer: Federal, State, Local not specified - PPO | Admitting: Ophthalmology

## 2020-05-01 ENCOUNTER — Encounter (INDEPENDENT_AMBULATORY_CARE_PROVIDER_SITE_OTHER): Payer: Federal, State, Local not specified - PPO | Admitting: Ophthalmology

## 2020-05-05 ENCOUNTER — Encounter (INDEPENDENT_AMBULATORY_CARE_PROVIDER_SITE_OTHER): Payer: Federal, State, Local not specified - PPO | Admitting: Ophthalmology

## 2020-05-19 ENCOUNTER — Encounter (INDEPENDENT_AMBULATORY_CARE_PROVIDER_SITE_OTHER): Payer: Federal, State, Local not specified - PPO | Admitting: Ophthalmology

## 2020-05-19 ENCOUNTER — Other Ambulatory Visit: Payer: Self-pay

## 2020-05-19 DIAGNOSIS — H35033 Hypertensive retinopathy, bilateral: Secondary | ICD-10-CM

## 2020-05-19 DIAGNOSIS — E103391 Type 1 diabetes mellitus with moderate nonproliferative diabetic retinopathy without macular edema, right eye: Secondary | ICD-10-CM

## 2020-05-19 DIAGNOSIS — E103292 Type 1 diabetes mellitus with mild nonproliferative diabetic retinopathy without macular edema, left eye: Secondary | ICD-10-CM

## 2020-05-19 DIAGNOSIS — H43813 Vitreous degeneration, bilateral: Secondary | ICD-10-CM

## 2020-05-19 DIAGNOSIS — I10 Essential (primary) hypertension: Secondary | ICD-10-CM | POA: Diagnosis not present

## 2020-11-18 ENCOUNTER — Other Ambulatory Visit: Payer: Self-pay

## 2020-11-18 ENCOUNTER — Encounter (INDEPENDENT_AMBULATORY_CARE_PROVIDER_SITE_OTHER): Payer: Federal, State, Local not specified - PPO | Admitting: Ophthalmology

## 2020-11-18 DIAGNOSIS — E103292 Type 1 diabetes mellitus with mild nonproliferative diabetic retinopathy without macular edema, left eye: Secondary | ICD-10-CM

## 2020-11-18 DIAGNOSIS — E103391 Type 1 diabetes mellitus with moderate nonproliferative diabetic retinopathy without macular edema, right eye: Secondary | ICD-10-CM | POA: Diagnosis not present

## 2020-11-18 DIAGNOSIS — I10 Essential (primary) hypertension: Secondary | ICD-10-CM

## 2020-11-18 DIAGNOSIS — H35033 Hypertensive retinopathy, bilateral: Secondary | ICD-10-CM | POA: Diagnosis not present

## 2020-11-18 DIAGNOSIS — H43813 Vitreous degeneration, bilateral: Secondary | ICD-10-CM

## 2021-05-20 ENCOUNTER — Encounter (INDEPENDENT_AMBULATORY_CARE_PROVIDER_SITE_OTHER): Payer: Federal, State, Local not specified - PPO | Admitting: Ophthalmology

## 2021-05-20 ENCOUNTER — Other Ambulatory Visit: Payer: Self-pay

## 2021-05-20 DIAGNOSIS — E103393 Type 1 diabetes mellitus with moderate nonproliferative diabetic retinopathy without macular edema, bilateral: Secondary | ICD-10-CM

## 2021-05-20 DIAGNOSIS — H35033 Hypertensive retinopathy, bilateral: Secondary | ICD-10-CM

## 2021-05-20 DIAGNOSIS — H43813 Vitreous degeneration, bilateral: Secondary | ICD-10-CM

## 2021-05-20 DIAGNOSIS — I10 Essential (primary) hypertension: Secondary | ICD-10-CM

## 2021-12-08 ENCOUNTER — Encounter (INDEPENDENT_AMBULATORY_CARE_PROVIDER_SITE_OTHER): Payer: Federal, State, Local not specified - PPO | Admitting: Ophthalmology

## 2022-01-10 ENCOUNTER — Other Ambulatory Visit: Payer: Self-pay | Admitting: Obstetrics and Gynecology

## 2022-01-10 DIAGNOSIS — R928 Other abnormal and inconclusive findings on diagnostic imaging of breast: Secondary | ICD-10-CM

## 2022-01-14 ENCOUNTER — Other Ambulatory Visit: Payer: Federal, State, Local not specified - PPO

## 2022-01-18 ENCOUNTER — Ambulatory Visit: Payer: Federal, State, Local not specified - PPO

## 2022-01-18 ENCOUNTER — Ambulatory Visit
Admission: RE | Admit: 2022-01-18 | Discharge: 2022-01-18 | Disposition: A | Discharge status: Home / Self Care | Payer: Federal, State, Local not specified - PPO | Source: Ambulatory Visit | Attending: Obstetrics and Gynecology | Admitting: Obstetrics and Gynecology

## 2022-01-18 DIAGNOSIS — R928 Other abnormal and inconclusive findings on diagnostic imaging of breast: Secondary | ICD-10-CM

## 2022-01-31 ENCOUNTER — Encounter (INDEPENDENT_AMBULATORY_CARE_PROVIDER_SITE_OTHER): Payer: Federal, State, Local not specified - PPO | Admitting: Ophthalmology

## 2022-01-31 DIAGNOSIS — H35033 Hypertensive retinopathy, bilateral: Secondary | ICD-10-CM | POA: Diagnosis not present

## 2022-01-31 DIAGNOSIS — E103391 Type 1 diabetes mellitus with moderate nonproliferative diabetic retinopathy without macular edema, right eye: Secondary | ICD-10-CM | POA: Diagnosis not present

## 2022-01-31 DIAGNOSIS — I10 Essential (primary) hypertension: Secondary | ICD-10-CM | POA: Diagnosis not present

## 2022-01-31 DIAGNOSIS — E103292 Type 1 diabetes mellitus with mild nonproliferative diabetic retinopathy without macular edema, left eye: Secondary | ICD-10-CM

## 2022-01-31 DIAGNOSIS — H43813 Vitreous degeneration, bilateral: Secondary | ICD-10-CM

## 2022-08-01 ENCOUNTER — Encounter (INDEPENDENT_AMBULATORY_CARE_PROVIDER_SITE_OTHER): Payer: Federal, State, Local not specified - PPO | Admitting: Ophthalmology

## 2022-08-01 DIAGNOSIS — I10 Essential (primary) hypertension: Secondary | ICD-10-CM | POA: Diagnosis not present

## 2022-08-01 DIAGNOSIS — H35033 Hypertensive retinopathy, bilateral: Secondary | ICD-10-CM | POA: Diagnosis not present

## 2022-08-01 DIAGNOSIS — H43813 Vitreous degeneration, bilateral: Secondary | ICD-10-CM

## 2022-08-01 DIAGNOSIS — E103393 Type 1 diabetes mellitus with moderate nonproliferative diabetic retinopathy without macular edema, bilateral: Secondary | ICD-10-CM | POA: Diagnosis not present

## 2023-02-03 ENCOUNTER — Encounter (INDEPENDENT_AMBULATORY_CARE_PROVIDER_SITE_OTHER): Payer: Federal, State, Local not specified - PPO | Admitting: Ophthalmology

## 2023-02-16 ENCOUNTER — Encounter (INDEPENDENT_AMBULATORY_CARE_PROVIDER_SITE_OTHER): Payer: Federal, State, Local not specified - PPO | Admitting: Ophthalmology

## 2023-02-16 DIAGNOSIS — I10 Essential (primary) hypertension: Secondary | ICD-10-CM | POA: Diagnosis not present

## 2023-02-16 DIAGNOSIS — E103392 Type 1 diabetes mellitus with moderate nonproliferative diabetic retinopathy without macular edema, left eye: Secondary | ICD-10-CM

## 2023-02-16 DIAGNOSIS — Z794 Long term (current) use of insulin: Secondary | ICD-10-CM

## 2023-02-16 DIAGNOSIS — E103291 Type 1 diabetes mellitus with mild nonproliferative diabetic retinopathy without macular edema, right eye: Secondary | ICD-10-CM | POA: Diagnosis not present

## 2023-02-16 DIAGNOSIS — H43813 Vitreous degeneration, bilateral: Secondary | ICD-10-CM

## 2023-02-16 DIAGNOSIS — H35033 Hypertensive retinopathy, bilateral: Secondary | ICD-10-CM

## 2023-08-17 ENCOUNTER — Encounter (INDEPENDENT_AMBULATORY_CARE_PROVIDER_SITE_OTHER): Payer: Federal, State, Local not specified - PPO | Admitting: Ophthalmology

## 2023-08-29 ENCOUNTER — Encounter (INDEPENDENT_AMBULATORY_CARE_PROVIDER_SITE_OTHER): Admitting: Ophthalmology

## 2023-08-29 DIAGNOSIS — I10 Essential (primary) hypertension: Secondary | ICD-10-CM | POA: Diagnosis not present

## 2023-08-29 DIAGNOSIS — E103292 Type 1 diabetes mellitus with mild nonproliferative diabetic retinopathy without macular edema, left eye: Secondary | ICD-10-CM

## 2023-08-29 DIAGNOSIS — H35033 Hypertensive retinopathy, bilateral: Secondary | ICD-10-CM

## 2023-08-29 DIAGNOSIS — Z794 Long term (current) use of insulin: Secondary | ICD-10-CM

## 2023-08-29 DIAGNOSIS — H43813 Vitreous degeneration, bilateral: Secondary | ICD-10-CM

## 2023-08-29 DIAGNOSIS — E103391 Type 1 diabetes mellitus with moderate nonproliferative diabetic retinopathy without macular edema, right eye: Secondary | ICD-10-CM

## 2023-10-10 LAB — COLOGUARD: COLOGUARD: NEGATIVE

## 2023-12-04 ENCOUNTER — Ambulatory Visit (INDEPENDENT_AMBULATORY_CARE_PROVIDER_SITE_OTHER): Admitting: Otolaryngology

## 2023-12-04 ENCOUNTER — Encounter (INDEPENDENT_AMBULATORY_CARE_PROVIDER_SITE_OTHER): Payer: Self-pay | Admitting: Otolaryngology

## 2023-12-04 VITALS — BP 126/78 | HR 68

## 2023-12-04 DIAGNOSIS — R0981 Nasal congestion: Secondary | ICD-10-CM

## 2023-12-04 DIAGNOSIS — J324 Chronic pansinusitis: Secondary | ICD-10-CM

## 2023-12-04 DIAGNOSIS — J343 Hypertrophy of nasal turbinates: Secondary | ICD-10-CM | POA: Diagnosis not present

## 2023-12-04 DIAGNOSIS — J310 Chronic rhinitis: Secondary | ICD-10-CM

## 2023-12-04 DIAGNOSIS — Z87891 Personal history of nicotine dependence: Secondary | ICD-10-CM | POA: Diagnosis not present

## 2023-12-04 NOTE — Progress Notes (Unsigned)
 CC: Recurrent sinusitis  HPI:  Brandi Blevins is a 48 y.o. female who presents today complaining of recurrent sinusitis over the past year.  Her symptoms include facial pain and pressure, nasal drainage, and nasal congestion.  She was treated with multiple courses of antibiotics.  She has had 5 courses of antibiotics over the past year.  Her last 1 was 2 months ago.  In addition, she also complains of intermittent right ear pain.  The patient previously underwent bilateral maxillary sinus surgery, septoplasty, and turbinate reduction in 2009.  Currently she is using Flonase and Astelin as needed.  She denies any fever or visual change.  Past Medical History:  Diagnosis Date   Abrasion of right hand 04/02/2013   Anxiety    Complication of anesthesia    Family history of anesthesia complication    pt's mother has hx. of post-op N/V   History of seizures    due to low blood sugar   Hypertension    under control with med., has been on med. x 1 1/2 yr.   IDDM (insulin  dependent diabetes mellitus)    on insulin  pump   PONV (postoperative nausea and vomiting)    Poor intravenous access    Poor sleep pattern    sleep study done 09/2009 - AHI 0.2/hr.   Seasonal allergies    Trigger thumb of left hand 03/2013    Past Surgical History:  Procedure Laterality Date   ANKLE SURGERY Left    x 2   CESAREAN SECTION  04/20/2012   Procedure: CESAREAN SECTION;  Surgeon: Marjorie DEL. Okey, MD;  Location: [facility];  Service: Obstetrics;  Laterality: N/A;   NASAL SEPTUM SURGERY     TONSILLECTOMY AND ADENOIDECTOMY     TRIGGER FINGER RELEASE Left 04/08/2013   Procedure: MINOR RELEASE TRIGGER FINGER/A-1 PULLEY;  Surgeon: Dempsey JINNY Sensor, MD;  Location: [facility];  Service: Orthopedics;  Laterality: Left;   WISDOM TOOTH EXTRACTION      Family History  Problem Relation Age of Onset   Anesthesia problems Mother        post-op N/V   Arthritis Mother    Diabetes Father     Social History:   reports that she quit smoking about 18 years ago. She has never used smokeless tobacco. She reports current alcohol use. She reports that she does not use drugs.  Allergies:  Allergies  Allergen Reactions   Wasp Venom Protein Anaphylaxis   Advil [Ibuprofen] Hives    Prior to Admission medications   Medication Sig Start Date End Date Taking? Authorizing Provider  ALPRAZolam (XANAX) 1 MG tablet Take 1 mg by mouth at bedtime as needed. 05/17/16  Yes [provider]  buPROPion (WELLBUTRIN XL) 150 MG 24 hr tablet Take 450 mg by mouth daily. 04/06/16  Yes [provider]  desloratadine (CLARINEX) 5 MG tablet Take 5 mg by mouth daily.   Yes [provider]  Drospirenone-Ethinyl Estradiol (YASMIN 28 PO) Take by mouth.   Yes [provider]  fluocinonide (LIDEX) 0.05 % external solution Apply 1 application topically 2 (two) times daily.   Yes [provider]  Insulin  Human (INSULIN  PUMP) 100 unit/ml SOLN Inject 1 each into the skin as needed. novolog insulin    Yes [provider]  ketoconazole (NIZORAL) 2 % shampoo Apply 1 application topically.   Yes [provider]  levocetirizine (XYZAL ) 5 MG tablet Take 5 mg by mouth every evening.    Yes [provider]  montelukast (SINGULAIR) 10 MG tablet Take 10 mg by mouth at bedtime.   Yes [provider]  NIFEdipine  (PROCARDIA  XL/ADALAT -CC) 90 MG 24 hr tablet Take 1 tablet by mouth daily. 11/20/14  Yes [provider]  Olopatadine HCl 0.2 % SOLN Apply to eye.   Yes [provider]  Suvorexant (BELSOMRA) 15 MG TABS Take 1 tablet by mouth daily. 02/12/15  Yes [provider]    Blood pressure 126/78, pulse 68, SpO2 98%, unknown if currently breastfeeding. Exam: General: Communicates without difficulty, well nourished, no acute distress. Head: Normocephalic, no evidence injury, no tenderness, facial buttresses intact without stepoff. Face/sinus: No  tenderness to palpation and percussion. Facial movement is normal and symmetric. Eyes: PERRL, EOMI. No scleral icterus, conjunctivae clear. Neuro: CN II exam reveals vision grossly intact.  No nystagmus at any point of gaze. Ears: Auricles well formed without lesions.  Ear canals are intact without mass or lesion.  No erythema or edema is appreciated.  The TMs are intact without fluid. Nose: External evaluation reveals normal support and skin without lesions.  Dorsum is intact.  Anterior rhinoscopy reveals congested mucosa over anterior aspect of inferior turbinates and intact septum.  No purulence noted. Oral:  Oral cavity and oropharynx are intact, symmetric, without erythema or edema.  Mucosa is moist without lesions. Neck: Full range of motion without pain.  There is no significant lymphadenopathy.  No masses palpable.  Thyroid bed within normal limits to palpation.  Parotid glands and submandibular glands equal bilaterally without mass.  Trachea is midline. Neuro:  CN 2-12 grossly intact.   Procedure:  Flexible Nasal Endoscopy: Description: Risks, benefits, and alternatives of flexible endoscopy were explained to the patient.  Specific mention was made of the risk of throat numbness with difficulty swallowing, possible bleeding from the nose and mouth, and pain from the procedure.  The patient gave oral consent to proceed.  The flexible scope was inserted into the right nasal cavity.  Endoscopy of the interior nasal cavity, superior, inferior, and middle meatus was performed. The sphenoid-ethmoid recess was examined. Edematous mucosa was noted.  The sinus openings were patent.  No polyp, mass, or lesion was appreciated. Olfactory cleft was clear.  Nasopharynx was clear.  Turbinates were hypertrophied but without mass.  The procedure was repeated on the contralateral side with similar findings.  The patient tolerated the procedure well.    Assessment: 1.  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 2.  However, no purulent drainage or acute infection is noted today.  Plan: 1.  The physical exam and nasal endoscopy findings are reviewed with the patient. 2.  Flonase nasal spray and nasal saline irrigation daily.  The importance of consistent daily use is discussed. 3.  The patient will return for reevaluation in 2 months.  If she continues to be symptomatic, we will obtain a sinus CT scan at that time.  Chaquana Nichols W Lekeith Wulf 12/04/2023, 2:22 PM

## 2023-12-05 DIAGNOSIS — J324 Chronic pansinusitis: Secondary | ICD-10-CM | POA: Insufficient documentation

## 2023-12-05 DIAGNOSIS — J343 Hypertrophy of nasal turbinates: Secondary | ICD-10-CM | POA: Insufficient documentation

## 2024-02-08 ENCOUNTER — Encounter (INDEPENDENT_AMBULATORY_CARE_PROVIDER_SITE_OTHER): Payer: Self-pay | Admitting: Otolaryngology

## 2024-02-08 ENCOUNTER — Ambulatory Visit (INDEPENDENT_AMBULATORY_CARE_PROVIDER_SITE_OTHER): Admitting: Otolaryngology

## 2024-02-08 VITALS — BP 131/84 | HR 74 | Temp 99.4°F

## 2024-02-08 DIAGNOSIS — J310 Chronic rhinitis: Secondary | ICD-10-CM | POA: Diagnosis not present

## 2024-02-08 DIAGNOSIS — J324 Chronic pansinusitis: Secondary | ICD-10-CM

## 2024-02-08 DIAGNOSIS — R0981 Nasal congestion: Secondary | ICD-10-CM

## 2024-02-08 DIAGNOSIS — J343 Hypertrophy of nasal turbinates: Secondary | ICD-10-CM

## 2024-02-08 MED ORDER — AZELASTINE HCL 0.1 % NA SOLN: 2.0000 | Freq: Two times a day (BID) | NASAL | 12 refills | Status: AC | PRN

## 2024-02-08 NOTE — Progress Notes (Signed)
 Patient ID: Brandi Blevins, female   DOB: [birth year], 48 y.o.   MRN: [id number]  Follow up: Recurrent sinusitis, chronic nasal congestion  Discussed the use of AI scribe software for clinical note transcription with the patient, who gave verbal consent to proceed.  History of Present Illness Brandi Blevins is a 48 year old female who presents with sinus congestion.  She has not experienced any sinus infections recently despite the ongoing fall allergy season. Her allergy management regimen includes Flonase nasal spray, saline rinses, and azelastine nasal spray. Azelastine is used as needed for symptoms such as sneezing, itchy eyes, and itchy nose, while Flonase and saline are used daily.  She experiences seasonal sinus congestion, primarily in the fall and spring. She experiences mild discomfort when pressure is applied to her face.  She has been managing her symptoms with her current medication regimen. She notes that the allergy season has been particularly challenging this year, with persistent pollen despite colder weather.  Exam: General: Communicates without difficulty, well nourished, no acute distress. Head: Normocephalic, no evidence injury, no tenderness, facial buttresses intact without stepoff. Face/sinus: No tenderness to palpation and percussion. Facial movement is normal and symmetric. Eyes: PERRL, EOMI. No scleral icterus, conjunctivae clear. Neuro: CN II exam reveals vision grossly intact.  No nystagmus at any point of gaze. Ears: Auricles well formed without lesions.  Ear canals are intact without mass or lesion.  No erythema or edema is appreciated.  The TMs are intact without fluid. Nose: External evaluation reveals normal support and skin without lesions.  Dorsum is intact.  Anterior rhinoscopy reveals congested mucosa over anterior aspect of inferior turbinates and intact septum.  No purulence noted. Oral:  Oral cavity and oropharynx are intact, symmetric, without erythema or  edema.  Mucosa is moist without lesions. Neck: Full range of motion without pain.  There is no significant lymphadenopathy.  No masses palpable.  Thyroid bed within normal limits to palpation.  Parotid glands and submandibular glands equal bilaterally without mass.  Trachea is midline. Neuro:  CN 2-12 grossly intact.    Assessment and Plan Assessment & Plan Recurrent rhinosinusitis and bilateral inferior turbinate hypertrophy Recurrent rhinosinusitis with seasonal exacerbations, particularly in fall and spring. Currently experiencing left-sided nasal congestion, though not completely obstructing. Symptoms are managed with Flonase and azelastine nasal sprays, and saline rinses. No recent sinus infections reported.  - Continue Flonase nasal spray daily. - Use azelastine nasal spray as needed for sneezing, itchy eyes, and itchy nose. - Continue saline nasal rinses daily. - Refill azelastine nasal spray prescription. - Return for follow-up in March to reassess condition. - Contact office if symptoms worsen or if there are any issues before the next appointment.

## 2024-02-19 ENCOUNTER — Encounter (INDEPENDENT_AMBULATORY_CARE_PROVIDER_SITE_OTHER): Admitting: Ophthalmology

## 2024-02-19 DIAGNOSIS — Z794 Long term (current) use of insulin: Secondary | ICD-10-CM

## 2024-02-19 DIAGNOSIS — E103391 Type 1 diabetes mellitus with moderate nonproliferative diabetic retinopathy without macular edema, right eye: Secondary | ICD-10-CM | POA: Diagnosis not present

## 2024-02-19 DIAGNOSIS — E103292 Type 1 diabetes mellitus with mild nonproliferative diabetic retinopathy without macular edema, left eye: Secondary | ICD-10-CM | POA: Diagnosis not present

## 2024-02-19 DIAGNOSIS — I10 Essential (primary) hypertension: Secondary | ICD-10-CM | POA: Diagnosis not present

## 2024-02-19 DIAGNOSIS — H35033 Hypertensive retinopathy, bilateral: Secondary | ICD-10-CM

## 2024-02-19 DIAGNOSIS — H43813 Vitreous degeneration, bilateral: Secondary | ICD-10-CM

## 2024-06-25 ENCOUNTER — Ambulatory Visit (INDEPENDENT_AMBULATORY_CARE_PROVIDER_SITE_OTHER): Admitting: Otolaryngology

## 2024-08-14 ENCOUNTER — Encounter (INDEPENDENT_AMBULATORY_CARE_PROVIDER_SITE_OTHER): Admitting: Ophthalmology
